# Patient Record
Sex: Female | Born: 1955 | Race: Black or African American | Marital: Single | Smoking: Never smoker
Health system: Southern US, Community
[De-identification: ages and names within clinical notes are randomized; demographics above are authoritative.]

## PROBLEM LIST (undated history)

## (undated) ENCOUNTER — Ambulatory Visit (INDEPENDENT_AMBULATORY_CARE_PROVIDER_SITE_OTHER): Admission: RE | Payer: Self-pay

## (undated) ENCOUNTER — Ambulatory Visit (INDEPENDENT_AMBULATORY_CARE_PROVIDER_SITE_OTHER): Admission: RE | Payer: Self-pay | Admitting: Vascular Neurology

## (undated) DIAGNOSIS — D509 Iron deficiency anemia, unspecified: Secondary | ICD-10-CM

## (undated) DIAGNOSIS — H269 Unspecified cataract: Secondary | ICD-10-CM

## (undated) DIAGNOSIS — R0602 Shortness of breath: Secondary | ICD-10-CM

## (undated) DIAGNOSIS — E785 Hyperlipidemia, unspecified: Secondary | ICD-10-CM

## (undated) DIAGNOSIS — I10 Essential (primary) hypertension: Secondary | ICD-10-CM

## (undated) DIAGNOSIS — M1990 Unspecified osteoarthritis, unspecified site: Secondary | ICD-10-CM

## (undated) DIAGNOSIS — R7303 Prediabetes: Secondary | ICD-10-CM

## (undated) HISTORY — PX: TONSILLECTOMY: SUR1361

## (undated) HISTORY — PX: OTHER SURGICAL HISTORY: SHX169

## (undated) HISTORY — PX: ADENOIDECTOMY: SUR15

## (undated) HISTORY — PX: MYOMECTOMY ABDOMINAL APPROACH: SUR870

## (undated) HISTORY — DX: Hyperlipidemia, unspecified: E78.5

## (undated) HISTORY — DX: Essential (primary) hypertension: I10

## (undated) HISTORY — PX: WISDOM TOOTH EXTRACTION: SHX21

## (undated) HISTORY — DX: Iron deficiency anemia, unspecified: D50.9

## (undated) HISTORY — PX: COLONOSCOPY: SHX174

## (undated) HISTORY — DX: Shortness of breath: R06.02

---

## 1992-06-18 HISTORY — PX: DIAGNOSTIC LAPAROSCOPY: SUR761

## 1997-05-27 ENCOUNTER — Ambulatory Visit
Admit: 1997-05-27 | Disposition: A | Discharge status: Home / Self Care | Payer: Self-pay | Source: Ambulatory Visit | Admitting: Physical Medicine & Rehabilitation

## 2003-06-21 ENCOUNTER — Ambulatory Visit: Admit: 2003-06-21 | Disposition: A | Discharge status: Home / Self Care | Payer: Self-pay | Source: Ambulatory Visit

## 2004-08-21 ENCOUNTER — Ambulatory Visit: Admit: 2004-08-21 | Disposition: A | Discharge status: Home / Self Care | Payer: Self-pay | Source: Ambulatory Visit

## 2004-09-14 ENCOUNTER — Ambulatory Visit: Admit: 2004-09-14 | Disposition: A | Discharge status: Home / Self Care | Payer: Self-pay | Source: Ambulatory Visit

## 2006-12-05 ENCOUNTER — Ambulatory Visit
Admit: 2006-12-05 | Disposition: A | Discharge status: Home / Self Care | Payer: Self-pay | Source: Ambulatory Visit | Admitting: Gynecology

## 2006-12-11 ENCOUNTER — Ambulatory Visit
Admit: 2006-12-11 | Disposition: A | Discharge status: Home / Self Care | Payer: Self-pay | Source: Ambulatory Visit | Admitting: Gynecology

## 2009-06-18 HISTORY — PX: BIOPSY BREAST: PRO8

## 2010-05-24 ENCOUNTER — Ambulatory Visit: Payer: Self-pay

## 2010-05-29 LAB — LAB USE ONLY - HISTORICAL SURGICAL PATHOLOGY

## 2011-03-28 ENCOUNTER — Encounter (INDEPENDENT_AMBULATORY_CARE_PROVIDER_SITE_OTHER): Payer: Self-pay | Admitting: Vascular Neurology

## 2011-04-23 ENCOUNTER — Other Ambulatory Visit (INDEPENDENT_AMBULATORY_CARE_PROVIDER_SITE_OTHER): Payer: Self-pay | Admitting: Vascular Neurology

## 2011-04-23 DIAGNOSIS — R51 Headache: Secondary | ICD-10-CM

## 2011-04-23 MED ORDER — AMITRIPTYLINE HCL 50 MG PO TABS
ORAL_TABLET | ORAL | Status: DC
Start: 2011-04-23 — End: 2011-09-14

## 2011-05-22 ENCOUNTER — Encounter (INDEPENDENT_AMBULATORY_CARE_PROVIDER_SITE_OTHER): Payer: Self-pay | Admitting: Vascular Neurology

## 2011-05-23 ENCOUNTER — Ambulatory Visit (INDEPENDENT_AMBULATORY_CARE_PROVIDER_SITE_OTHER): Payer: Worker's Compensation | Admitting: Vascular Neurology

## 2011-05-23 ENCOUNTER — Encounter (INDEPENDENT_AMBULATORY_CARE_PROVIDER_SITE_OTHER): Payer: Self-pay | Admitting: Vascular Neurology

## 2011-05-23 VITALS — BP 144/95 | HR 93 | Ht 62.0 in | Wt 157.8 lb

## 2011-05-23 DIAGNOSIS — R51 Headache: Secondary | ICD-10-CM

## 2011-05-23 DIAGNOSIS — R519 Headache, unspecified: Secondary | ICD-10-CM | POA: Insufficient documentation

## 2011-05-23 DIAGNOSIS — G44309 Post-traumatic headache, unspecified, not intractable: Secondary | ICD-10-CM | POA: Insufficient documentation

## 2011-05-23 DIAGNOSIS — R202 Paresthesia of skin: Secondary | ICD-10-CM

## 2011-05-23 DIAGNOSIS — R209 Unspecified disturbances of skin sensation: Secondary | ICD-10-CM

## 2011-05-23 NOTE — Progress Notes (Signed)
Subjective:      Patient ID: Bridget Gomez is a 55 y.o. female.    HPI  Since I last saw the patient, her headaches have been stable.  If she takes  the medications, the headaches are under fairly good control.  However, if  she has missed medication, the headache seems to be more prominent and  causes more issues.  She has been back at work and has not had any issues,  except when she forgets to take medication.  She has had to call in sick  from work few times because of this.  Strenuous movement seems to trigger  the headaches.  The headaches are typically holoacranial, of moderate  intensity, but without nausea or photophobia.  She recently began seeing an  orthopedist for complaints of right hand numbness.  She complains of  paresthesias in the right hand.  There is a prior history of carpal tunnel  syndrome.  She had been referred for physical therapy and has been using a  wrist brace.        Review of Systems   Neurological: Positive for numbness and headaches.       Objective:   Neurologic Exam    On exam, the patient is awake, alert, oriented and appropriate. Speech is fluent and he follows commands well. Attention and concentration appear intact. Cranial nerves show pupils equally round and reactive to light bilaterally. Extra-ocular movements are intact, without clear nystagmus. Face is symmetric. Tongue is midline. Motor strength is full throughout with normal appearing bulk. Sensation to light touch is grossly intact. Coordination by finger-to-nose testing was normal without dysmetria. Gait was normal and steady. + R Tinels      Assessment:     1. Headache - overall stable on current regimen.   2. Post-concussion headache    3. 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 Ortho has ordered EMG/NCS.         Plan:     1. Continue on Elavil - currently on 140mg  qhs; will try to reduce dose by 10 mg/week down to 100 mg and see if symptoms worsen or remain stable.  2. No change in Neurontin dose.  3.  Follow ortho recs RE: R arm; EMG/NCS.  4. Follow up few months.    The patient should call the office if symptoms worsen, new issues arise, or if has further questions.

## 2011-06-01 ENCOUNTER — Ambulatory Visit (INDEPENDENT_AMBULATORY_CARE_PROVIDER_SITE_OTHER): Payer: Self-pay | Admitting: Vascular Neurology

## 2011-06-06 ENCOUNTER — Encounter (INDEPENDENT_AMBULATORY_CARE_PROVIDER_SITE_OTHER): Payer: Self-pay | Admitting: Neurology

## 2011-06-06 ENCOUNTER — Other Ambulatory Visit (INDEPENDENT_AMBULATORY_CARE_PROVIDER_SITE_OTHER): Payer: Self-pay | Admitting: Vascular Neurology

## 2011-06-06 ENCOUNTER — Ambulatory Visit (INDEPENDENT_AMBULATORY_CARE_PROVIDER_SITE_OTHER): Payer: BC Managed Care – PPO

## 2011-06-06 ENCOUNTER — Ambulatory Visit (INDEPENDENT_AMBULATORY_CARE_PROVIDER_SITE_OTHER): Payer: BC Managed Care – PPO | Admitting: Neurology

## 2011-06-06 VITALS — BP 126/87 | HR 99 | Ht 62.0 in | Wt 160.0 lb

## 2011-06-06 DIAGNOSIS — G5600 Carpal tunnel syndrome, unspecified upper limb: Secondary | ICD-10-CM

## 2011-06-06 DIAGNOSIS — R202 Paresthesia of skin: Secondary | ICD-10-CM

## 2011-06-06 DIAGNOSIS — R209 Unspecified disturbances of skin sensation: Secondary | ICD-10-CM

## 2011-06-06 DIAGNOSIS — G5610 Other lesions of median nerve, unspecified upper limb: Secondary | ICD-10-CM

## 2011-06-06 NOTE — Progress Notes (Signed)
This is a 55 year old woman who follows up with Dr. Stacy Gardner, who is here for  EMG nerve conduction studies.  She has been referred by her orthopedic  doctor.  She has had symptoms of numbness, tingling, and spasm especially  in her right hand.     PHYSICAL EXAMINATION:    Strength is full in bilateral upper extremities.  Deep tendon reflexes are  bilaterally symmetrical, and sensation is intact to touch.     FINDINGS:  1.  The right median palmar sensory response showed a slightly prolonged  latency.  The left median palmar sensory response also showed very  minimally prolonged latency.  The bilateral radial sensory responses were  unremarkable.    2.  The right median motor response showed slightly prolonged  latency with normal amplitude and conduction velocity.  The left  median motor and bilateral ulnar motor responses were unremarkable.   3.  Bilateral median and ulnar F-wave latencies were unremarkable.    4.  Needle electromyography of the mentioned muscles of bilateral upper extremities was  unremarkable.     IMPRESSION:  This is an abnormal study.  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 There is no evidence of a  generalized large fiber neuropathy, myopathy, or bilateral cervical radiculopathy  on this study.  Clinical and radiological correlation is recommended.

## 2011-06-25 NOTE — Progress Notes (Signed)
Gwenevere Abbot, MD 06/09/2011 8:23 PM Signed   This is a 56 year old woman who follows up with Dr. Stacy Gardner, who is here for   EMG nerve conduction studies. She has been referred by her orthopedic   doctor. She has had symptoms of numbness, tingling, and spasm especially   in her right hand.   PHYSICAL EXAMINATION:   Strength is full in bilateral upper extremities. Deep tendon reflexes are   bilaterally symmetrical, and sensation is intact to touch.   FINDINGS:   1. The right median palmar sensory response showed a slightly prolonged   latency. The left median palmar sensory response also showed very   minimally prolonged latency. The bilateral radial sensory responses were   unremarkable.   2. The right median motor response showed slightly prolonged   latency with normal amplitude and conduction velocity. The left   median motor and bilateral ulnar motor responses were unremarkable.   3. Bilateral median and ulnar F-wave latencies were unremarkable.   4. Needle electromyography of the mentioned muscles of bilateral upper extremities was   unremarkable.   IMPRESSION:   This is an abnormal study. 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 There is no evidence of a   generalized large fiber neuropathy, myopathy, or bilateral cervical radiculopathy   on this study. Clinical and radiological correlation is recommended.

## 2011-08-21 ENCOUNTER — Ambulatory Visit (INDEPENDENT_AMBULATORY_CARE_PROVIDER_SITE_OTHER): Payer: Self-pay | Admitting: Vascular Neurology

## 2011-08-23 ENCOUNTER — Ambulatory Visit (INDEPENDENT_AMBULATORY_CARE_PROVIDER_SITE_OTHER): Payer: Self-pay | Admitting: Vascular Neurology

## 2011-08-24 ENCOUNTER — Encounter (INDEPENDENT_AMBULATORY_CARE_PROVIDER_SITE_OTHER): Payer: Self-pay | Admitting: Vascular Neurology

## 2011-09-14 ENCOUNTER — Ambulatory Visit (INDEPENDENT_AMBULATORY_CARE_PROVIDER_SITE_OTHER): Payer: Worker's Compensation | Admitting: Vascular Neurology

## 2011-09-14 ENCOUNTER — Encounter (INDEPENDENT_AMBULATORY_CARE_PROVIDER_SITE_OTHER): Payer: Self-pay | Admitting: Vascular Neurology

## 2011-09-14 VITALS — BP 124/84 | HR 88 | Ht 62.0 in | Wt 160.0 lb

## 2011-09-14 DIAGNOSIS — G44309 Post-traumatic headache, unspecified, not intractable: Secondary | ICD-10-CM

## 2011-09-14 DIAGNOSIS — R51 Headache: Secondary | ICD-10-CM

## 2011-09-14 DIAGNOSIS — R209 Unspecified disturbances of skin sensation: Secondary | ICD-10-CM

## 2011-09-14 DIAGNOSIS — R202 Paresthesia of skin: Secondary | ICD-10-CM

## 2011-09-14 DIAGNOSIS — M542 Cervicalgia: Secondary | ICD-10-CM

## 2011-09-14 MED ORDER — AMITRIPTYLINE HCL 50 MG PO TABS
100.00 mg | ORAL_TABLET | Freq: Every evening | ORAL | Status: DC
Start: 2011-09-14 — End: 2013-03-10

## 2011-09-14 NOTE — Progress Notes (Signed)
Subjective:      Patient ID: Bridget Gomez is a 56 y.o. female.    HPI  Since I last saw the patient, her headaches have been getting better.   However, she did have another work injury recently.  She was in a chair,  and the chair fell on her and hit the back of her neck.  She had a bad  headache then, but the symptoms have subsided a bit.  She has been doing  physical therapy for her neck and back discomfort.  She has been seeing a  workplace doctor for that.  Despite the recent incident, her headaches have  been doing well.  She has been able to cut down to 100 mg nightly of  Elavil, and her headaches have remained stable.  The gabapentin is  unchanged.  A recent EMG nerve conduction testing showed right greater than  left carpal tunnel syndrome, and she was given a steroid shot by her  orthopedists.  The symptoms have subsided since then.        Review of Systems   Musculoskeletal: Positive for back pain.   Neurological: Positive for headaches.   All other systems reviewed and are negative.        Objective:   Neurologic Exam    On exam, the patient is awake, alert, oriented and appropriate. Speech is fluent and the patient follows commands well. Attention, memory, and concentration appear intact. Cranial nerves show pupils equally round and reactive to light bilaterally. Extra-ocular movements are intact, without clear nystagmus. Face is symmetric. Tongue is midline. Motor strength is full throughout without clear focality, with normal appearing bulk. Sensation to light touch is grossly intact. Coordination by finger-to-nose testing was normal without dysmetria. Gait was normal and steady.      Assessment:     1. Headache - overall improved, but recent flare with most recent injury.   2. Post-concussion headache    3. Paresthesia of left upper limb    4. Neck pain          Plan:     1. Can continue to try and reduce Elavil by 10 mg/week if tolerated.  2. Continue with Neurontin at current dose.  3. Wrist  splints prn.  4. Continue PT for neck/back.  5. FMLA forms filled out in conjunction with this visit.  5. Follow up in several months.    The patient will return in follow-up as outlined above. If there is difficulty with the medications or questions about the test results as they become available and/or if the patient develops any new neurologic symptoms or worsening of current symptoms, he/she is instructed to contact us by telephone.    NOTE: In conjunction with todays evaluation and exam, the patient may have filled out forms with additional information about their medical history. This form would be scanned in the system for future reference, and was reviewed by the physician as part of the patient's evaluation.

## 2011-10-01 ENCOUNTER — Encounter (INDEPENDENT_AMBULATORY_CARE_PROVIDER_SITE_OTHER): Payer: Self-pay

## 2011-10-01 NOTE — Progress Notes (Signed)
Forms completed and faxed to [employer].

## 2011-10-02 ENCOUNTER — Encounter (INDEPENDENT_AMBULATORY_CARE_PROVIDER_SITE_OTHER): Payer: Self-pay | Admitting: Vascular Neurology

## 2011-10-10 ENCOUNTER — Other Ambulatory Visit (INDEPENDENT_AMBULATORY_CARE_PROVIDER_SITE_OTHER): Payer: Self-pay | Admitting: Vascular Neurology

## 2011-10-30 ENCOUNTER — Encounter (INDEPENDENT_AMBULATORY_CARE_PROVIDER_SITE_OTHER): Payer: Self-pay | Admitting: Vascular Neurology

## 2011-12-24 ENCOUNTER — Ambulatory Visit (INDEPENDENT_AMBULATORY_CARE_PROVIDER_SITE_OTHER): Payer: Worker's Compensation | Admitting: Vascular Neurology

## 2011-12-24 VITALS — BP 129/87 | HR 91

## 2011-12-24 DIAGNOSIS — G44309 Post-traumatic headache, unspecified, not intractable: Secondary | ICD-10-CM

## 2011-12-24 DIAGNOSIS — R51 Headache: Secondary | ICD-10-CM

## 2011-12-24 DIAGNOSIS — M542 Cervicalgia: Secondary | ICD-10-CM

## 2011-12-24 NOTE — Progress Notes (Signed)
Subjective:      Patient ID: Bridget Gomez is a 56 y.o. female.    HPI  The patient has been doing fairly well from a headache standpoint.  For the  most part, her headaches have improved significantly, and she has been  reducing the Elavil.  She recently reduced it to 50 mg nightly, last night.   At one point, she did have a headache and had increased the dose back, but  she has been able to cut back to 50 mg.  She has occasional head pains,  they usually last no more than a few seconds.  She has used Fioricet on a  p.r.n. basis.  Her neck still is an issue, and she has been getting  physical therapy and occupational therapy.  She may be sent to another  specialist at some point in the near future.  She wondered about whether  acupuncture could be helpful.  She otherwise continues on gabapentin,  Skelaxin, naproxen and inderal in addition to the Elavil.     She reports no other new complaints today.        Review of Systems    Constitutional: Negative for fever.   Respiratory: Negative for shortness of breath.    Cardiovascular: Negative for chest pain.   Gastrointestinal: Negative for abdominal pain.   Neurological: Negative for dizziness, weakness, numbness and headaches.   Psychiatric/Behavioral: Negative for disturbed wake/sleep cycle.   All other systems reviewed and are negative.      Objective:   Neurologic Exam    On exam, the patient is awake, alert, oriented and appropriate. Speech is fluent and the patient follows commands well. Attention, memory, and concentration appear intact. Cranial nerves show pupils equally round and reactive to light bilaterally. Extra-ocular movements are intact, without clear nystagmus. Face is symmetric. Tongue is midline. Motor strength is full throughout without clear focality, with normal appearing bulk. Sensation to light touch is grossly intact. Coordination by finger-to-nose testing was normal without dysmetria. Gait was normal and steady.      Assessment:     1.  Headache    2. Post-concussion headache    3. Neck pain          Plan:     1. Continue to reduce Elavil by 10mg  every few weeks.  2. Naproxen or Fioricet prn.  3. Continue Gabapentin.  4. Skelaxin, PT for neck pain. ? Consider acupuncture.  5. Follow up in several months.    The patient will return in follow-up as outlined above. If there is difficulty with the medications and/or if the patient develops any new neurologic symptoms or worsening of current symptoms, he/she is instructed to contact us by telephone.    NOTE: In conjunction with todays evaluation and exam, the patient may have filled out forms with additional information about their medical history. This form would be scanned in the system for future reference, and was reviewed by the physician as part of the patient's evaluation.

## 2012-04-07 ENCOUNTER — Encounter (INDEPENDENT_AMBULATORY_CARE_PROVIDER_SITE_OTHER): Payer: Self-pay

## 2012-04-07 NOTE — Progress Notes (Signed)
Called patient medication into the pharmacy Neurontin 300 mg TID DISP 180 CAP REFILLS 6

## 2012-04-29 ENCOUNTER — Ambulatory Visit (INDEPENDENT_AMBULATORY_CARE_PROVIDER_SITE_OTHER): Payer: Worker's Comp, Other unspecified | Admitting: Vascular Neurology

## 2012-04-29 VITALS — BP 122/82 | HR 94 | Ht 62.0 in | Wt 169.0 lb

## 2012-04-29 MED ORDER — BUTALBITAL-APAP-CAFFEINE 50-325-40 MG PO TABS
1.00 | ORAL_TABLET | Freq: Four times a day (QID) | ORAL | Status: DC | PRN
Start: 2012-04-29 — End: 2013-03-10

## 2012-04-29 NOTE — Progress Notes (Signed)
Subjective:       Patient ID: Bridget Gomez is a 56 y.o. female.    HPI    Since I last saw the patient, she has tapered off Elavil, about 2 months  ago, and has only had 1 bad headache since then.  She did not have the  Fioricet available unfortunately.  She has occasional paresthesia feelings in  her head at times, but for the most part has done well from a headache  standpoint.  She has not needed naproxen.  She continues on gabapentin 300  mg in the morning and 600 mg in the afternoon and evening.  She is now off  Skelaxin.  Since I last saw her, she had epidural injection for her neck  and has been getting trigger point injections which seemed to help.  She is  thinking that if she continues to improve, she may be able to go back to  work by next month.  She does not report any other new symptoms.      Review of Systems    Constitutional: Negative for fever.   Respiratory: Negative for shortness of breath.    Cardiovascular: Negative for chest pain.   Gastrointestinal: Negative for abdominal pain.   Neurological: Negative for dizziness, weakness, numbness and headaches.   Psychiatric/Behavioral: Negative for sleep disturbance.   All other systems reviewed and are negative.        Objective:    Physical Exam  The patient's mental status appears normal. Speech is fluent, and the patient follows commands well. Attention, memory, and concentration appear intact. Fund of knowledge appears appropriate. Cranial nerves show full extraocular movements and a symmetric face. Tongue appears midline. Motor exam shows good movement in all extremities without clear focality. Sensation grossly intact. Gait was normal and steady.        Assessment:       1. Headache    2. Post-concussion headache    3. Neck pain      She continues to make steady improvement with less frequent headaches now.          Plan:       1.  She can try tapering the gabapentin by 300 mg weekly for the next  several weeks to see if she continues to do  well on a lower dose.   2.  Fioricet as needed for headaches.  3.  Continue with pain management/trigger point injections, as long as she  finds them helpful.  4.  She has Skelaxin which can be used as needed, although she does not  need to use this.    5.  Follow up with me in a few months or contact the office sooner if  needed.    The patient will return for follow-up as outlined above. If there are any problems with medications (if prescribed) or questions about any available test results, or if there are any new symptoms or changes in current symptoms, the patient is instructed to call the office.     The patient may have filled out a questionnaire as part of the office visit and that information would be scanned into the chart and become part of the office visit. Notes from Dr. Stacy Gardner may also be scanned in and become part of the office visit.     More than 50% of the office visit was spent counseling the patient on one or more of the following:   -Disease state - discussion about the medical/neurological condition,  diagnostic and treatment options;  -Medications - indications and side effects   -Life style issues such as diet, exercise, tobacco and alcohol use, sleep, stress, depression and anxiety   -Primary and secondary stroke prevention, if applicable.    Wilda Wetherell B. Stacy Gardner, MD  Board Certified, Neurology  Board Certified, Clinical Neurophysiology  Board Certified, Electrodiagnostic Medicine  Board Certified, Vascular Neurology

## 2012-08-01 ENCOUNTER — Ambulatory Visit (INDEPENDENT_AMBULATORY_CARE_PROVIDER_SITE_OTHER): Payer: Worker's Comp, Other unspecified | Admitting: Vascular Neurology

## 2012-08-18 ENCOUNTER — Ambulatory Visit (INDEPENDENT_AMBULATORY_CARE_PROVIDER_SITE_OTHER): Payer: Worker's Comp, Other unspecified | Admitting: Vascular Neurology

## 2012-09-08 ENCOUNTER — Ambulatory Visit (INDEPENDENT_AMBULATORY_CARE_PROVIDER_SITE_OTHER): Payer: Worker's Comp, Other unspecified | Admitting: Vascular Neurology

## 2012-09-08 VITALS — BP 109/73 | HR 71 | Ht 62.0 in | Wt 153.0 lb

## 2012-09-08 NOTE — Progress Notes (Signed)
Subjective:      Patient ID: Bridget Gomez is a 57 y.o. female.    HPI  The patient continues to do better and things are going fairly well.  She  did have 1 "decent" headache in December, for which she took Fioricet, but  otherwise is stable.  She has been tapering off Gabapentin.  She has not  had any recent trigger point injections and may go back for some as her  neck has been bothering her.  She has been using a cream on her neck which  seems to help.      Review of Systems    Constitutional: Negative for fever.   Respiratory: Negative for shortness of breath.    Cardiovascular: Negative for chest pain.   Gastrointestinal: Negative for abdominal pain.   Neurological: Negative for dizziness, weakness, numbness and headaches.   Psychiatric/Behavioral: Negative for sleep disturbance.   All other systems reviewed and are negative.    Objective:   Neurologic Exam  Mental Status: The patient was awake, alert, appeared oriented and was appropriate. Speech was fluent and the patient followed commands well. Attention, concentration, and memory appeared intact. Fund of knowledge appeared appropriate for education level.   Cranial Nerves: Pupils were equally round and reactive to light bilaterally. Extraocular movements were intact without nystagmus. Hearing was intact to conversational speech. Face was symmetric. Tongue appeared midline.   Motor: Good/full strength throughout without clear focality or weakness. Bulk appeared normal for body habitus. No obvious tremor noted.   Sensation: light touch was grossly intact.   Coordination: Finger to nose testing was intact without dysmetria.   Gait: Normal and steady.      Assessment:     1. Headache    2. Post-concussion headache    3. Neck pain      Overall doing much better.        Plan:     1. Fioricet prn.  2. Continue tapering off GBP.  3. Follow up for TPI if needed.  4. Follow up in 6 months or contact the office sooner if needed.    The patient will return for  follow-up as outlined above. If there are any problems with medications (if prescribed) or questions about any available test results, or if there are any new symptoms or changes in current symptoms, the patient is instructed to call the office.     The patient may have filled out a questionnaire as part of the office visit and that information would be scanned into the chart and become part of the office visit. Notes from Dr. Stacy Gardner may also be scanned in and become part of the office visit.     More than 50% of the office visit was spent counseling the patient on one or more of the following:   -Disease state - discussion about the medical/neurological condition, diagnostic and treatment options;  -Medications - indications and side effects   -Life style issues such as diet, exercise, tobacco and alcohol use, sleep, stress, depression and anxiety   -Primary and secondary stroke prevention, if applicable.    Jody Silas B. Stacy Gardner, MD  Board Certified, Neurology  Board Certified, Clinical Neurophysiology  Board Certified, Electrodiagnostic Medicine  Board Certified, Vascular Neurology

## 2012-11-17 ENCOUNTER — Encounter (INDEPENDENT_AMBULATORY_CARE_PROVIDER_SITE_OTHER): Payer: Self-pay

## 2013-03-02 ENCOUNTER — Ambulatory Visit (INDEPENDENT_AMBULATORY_CARE_PROVIDER_SITE_OTHER): Payer: Worker's Comp, Other unspecified | Admitting: Vascular Neurology

## 2013-03-10 ENCOUNTER — Encounter (INDEPENDENT_AMBULATORY_CARE_PROVIDER_SITE_OTHER): Payer: Self-pay | Admitting: Vascular Neurology

## 2013-03-10 ENCOUNTER — Ambulatory Visit (INDEPENDENT_AMBULATORY_CARE_PROVIDER_SITE_OTHER): Payer: Worker's Comp, Other unspecified | Admitting: Vascular Neurology

## 2013-03-10 VITALS — BP 108/73 | HR 73

## 2013-03-10 DIAGNOSIS — G4700 Insomnia, unspecified: Secondary | ICD-10-CM | POA: Insufficient documentation

## 2013-03-10 MED ORDER — TRAZODONE HCL 50 MG PO TABS
25.0000 mg | ORAL_TABLET | Freq: Every evening | ORAL | Status: DC
Start: 2013-03-10 — End: 2013-07-20

## 2013-03-10 MED ORDER — BUTALBITAL-APAP-CAFFEINE 50-325-40 MG PO TABS
1.0000 | ORAL_TABLET | Freq: Four times a day (QID) | ORAL | Status: AC | PRN
Start: 2013-03-10 — End: 2013-03-20

## 2013-03-10 NOTE — Progress Notes (Signed)
Subjective:      Patient ID: Bridget Gomez is a 57 y.o. female.    HPI    The patient says things are done relatively well, although she did have  another injury that was somewhat work-related.  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  She had a CT of her head  that was negative, and she had some right arm discomfort but that has  subsequently improved.  She had a headache last week, but otherwise her  headaches seem to be the same frequency they were previously.  She was not  able to go to work with the last headache, because of the work schedule.   She is generally the same as before her most recent accident.  However, if  the headache occurs at work.  It does affect her ability to work.  She is  now off gabapentin and Elavil and lost 20+ pounds.  She uses Fioricet on an  as needed basis which seems to work relatively well.  She also has had continued issues with insomnia, which had been helped by  the Elavil.  She describes difficulty getting to sleep and at times wakes  up during the night.          Review of Systems    Constitutional: Negative for fever.   Respiratory: Negative for shortness of breath.    Cardiovascular: Negative for chest pain.   Gastrointestinal: Negative for abdominal pain.   Neurological: Negative for dizziness, weakness, numbness.   Psychiatric/Behavioral: Negative for sleep disturbance.   All other systems reviewed and are negative.    Objective:   Neurologic Exam  Mental Status: The patient was awake, alert, appeared oriented and was appropriate. Speech was fluent and the patient followed commands well. Attention, concentration, and memory appeared intact. Fund of knowledge appeared appropriate for education level.   Cranial Nerves: Pupils were equally round and reactive to light bilaterally. Extraocular movements were intact without nystagmus. Hearing was intact to conversational speech. Face was symmetric. Tongue appeared midline.   Motor: Good/full  strength throughout without clear focality or weakness. Bulk appeared normal for body habitus. No obvious tremor noted.   Sensation: light touch was grossly intact.   Coordination: Finger to nose testing was intact without dysmetria.   Gait: Normal and steady.      Assessment:     1. Post-concussion headache    2. Headache    3. Insomnia    4. Sleep disturbance    5. Headache(784.0)        Plan:     1. Fioricet prn.  2. Monitor for other new symptoms.   3. Filled out [employer] forms in conjunction with this visit.  4. Trial of trazodone for insomnia.  5. Follow up in a few months or contact the office sooner if needed.    The patient will return for follow-up as outlined above. If there are any problems with medications (if prescribed) or questions about any available test results, or if there are any new symptoms or changes in current symptoms, the patient is instructed to call the office.     The patient may have filled out a questionnaire as part of the office visit and that information would be scanned into the chart and become part of the office visit. Notes from Dr. Stacy Gardner may also be scanned in and become part of the office visit.     More than 50%  of the office visit was spent counseling the patient on one or more of the following:   -Disease state - discussion about the medical/neurological condition, diagnostic and treatment options;  -Medications - indications and side effects   -Life style issues such as diet, exercise, tobacco and alcohol use, sleep, stress, depression and anxiety   -Primary and secondary stroke prevention, if applicable.    Mackensie Pilson B. Stacy Gardner, MD  Board Certified, Neurology  Board Certified, Clinical Neurophysiology  Board Certified, Electrodiagnostic Medicine  Board Certified, Vascular Neurology

## 2013-03-16 ENCOUNTER — Ambulatory Visit (INDEPENDENT_AMBULATORY_CARE_PROVIDER_SITE_OTHER): Payer: Worker's Comp, Other unspecified | Admitting: Vascular Neurology

## 2013-04-07 ENCOUNTER — Encounter (INDEPENDENT_AMBULATORY_CARE_PROVIDER_SITE_OTHER): Payer: Self-pay | Admitting: Vascular Neurology

## 2013-04-19 ENCOUNTER — Encounter (INDEPENDENT_AMBULATORY_CARE_PROVIDER_SITE_OTHER): Payer: Self-pay | Admitting: Vascular Neurology

## 2013-05-21 ENCOUNTER — Encounter (INDEPENDENT_AMBULATORY_CARE_PROVIDER_SITE_OTHER): Payer: Self-pay | Admitting: Vascular Neurology

## 2013-05-29 ENCOUNTER — Ambulatory Visit (INDEPENDENT_AMBULATORY_CARE_PROVIDER_SITE_OTHER): Payer: Worker's Comp, Other unspecified | Admitting: Vascular Neurology

## 2013-06-03 ENCOUNTER — Telehealth (INDEPENDENT_AMBULATORY_CARE_PROVIDER_SITE_OTHER): Payer: Self-pay

## 2013-06-03 NOTE — Telephone Encounter (Signed)
Called in a 90 day supply of Trazodone #[phone] refills to pharmacy on file.

## 2013-07-20 ENCOUNTER — Ambulatory Visit (INDEPENDENT_AMBULATORY_CARE_PROVIDER_SITE_OTHER): Payer: Worker's Comp, Other unspecified | Admitting: Vascular Neurology

## 2013-07-20 ENCOUNTER — Encounter (INDEPENDENT_AMBULATORY_CARE_PROVIDER_SITE_OTHER): Payer: Self-pay | Admitting: Vascular Neurology

## 2013-07-20 VITALS — BP 132/86 | HR 71

## 2013-07-20 DIAGNOSIS — G44309 Post-traumatic headache, unspecified, not intractable: Secondary | ICD-10-CM

## 2013-07-20 DIAGNOSIS — G479 Sleep disorder, unspecified: Secondary | ICD-10-CM

## 2013-07-20 DIAGNOSIS — G4700 Insomnia, unspecified: Secondary | ICD-10-CM

## 2013-07-20 DIAGNOSIS — R51 Headache: Secondary | ICD-10-CM

## 2013-07-20 MED ORDER — TRAZODONE HCL 50 MG PO TABS
100.0000 mg | ORAL_TABLET | Freq: Every evening | ORAL | Status: DC
Start: 2013-07-20 — End: 2013-11-02

## 2013-07-20 NOTE — Progress Notes (Signed)
Have you sought care outside of  since we last saw you?   No

## 2013-07-20 NOTE — Progress Notes (Signed)
Subjective:      Patient ID: Bridget Gomez is a 58 y.o. female.    HPI  Things are going relatively well for the patient. In October, November, she  had a few more headaches usually lasting 10 minutes but she was able to  function.  Last week, she had a headache that required her to lie down.   She took Fioricet and was better about an hour later.  Last headache was  associated with increased stress.     She has been using trazodone 75 mg at night, which seems to help with her  sleep, although she still does not sleep throughout the night, but usually  gets at least 5 to 6 hours of solid sleep.        Review of Systems    Constitutional: Negative for fever.   Respiratory: Negative for shortness of breath.    Cardiovascular: Negative for chest pain.   Gastrointestinal: Negative for abdominal pain.   Neurological: Negative for dizziness, weakness, numbness.   Psychiatric/Behavioral: Positive for sleep disturbance.   All other systems reviewed and are negative.    Objective:   Neurologic Exam  Mental Status: The patient was awake, alert, appeared oriented and was appropriate. Speech was fluent and the patient followed commands well. Attention, concentration, and memory appeared intact. Fund of knowledge appeared appropriate for education level.   Cranial Nerves: Pupils were equally round and reactive to light bilaterally. Extraocular movements were intact without nystagmus. Hearing was intact to conversational speech. Face was symmetric. Tongue appeared midline.   Motor: Good/full strength throughout without clear focality or weakness. Bulk appeared normal for body habitus. No obvious tremor noted.   Sensation: light touch was grossly intact.   Coordination: Finger to nose testing was intact without dysmetria.   Gait: Normal and steady.      Assessment:     1. Post-concussion headache    2. Headache    3. Insomnia    4. Sleep disturbance          Plan:     1.  Increase trazodone to 100 mg nightly.  2.  Can use  Fioricet p.r.n. for headaches.  3.  Stress reduction techniques as able.  4.  Try to keep a diary of what may be causing any headaches and avoid  triggers if possible.  5.  Follow up with me in 6 months or contact the office sooner if needed.    The patient will return for follow-up as outlined above. If there are any problems with medications (if prescribed) or questions about any available test results, or if there are any new symptoms or changes in current symptoms, the patient is instructed to call the office.     The patient may have filled out a questionnaire as part of the office visit and that information would be scanned into the chart and become part of the office visit. Notes from Dr. Stacy Gardner may also be scanned in and become part of the office visit.     More than 50% of the office visit was spent counseling the patient on one or more of the following:   -Disease state - discussion about the medical/neurological condition, diagnostic and treatment options;  -Medications - indications and side effects   -Life style issues such as diet, exercise, tobacco and alcohol use, sleep, stress, depression and anxiety   -Primary and secondary stroke prevention, if applicable.    Darriona Dehaas B. Stacy Gardner, MD  Board Certified, Neurology  Board Certified, Clinical Neurophysiology  Board Certified, Electrodiagnostic Medicine  Board Certified, Vascular Neurology

## 2013-11-02 ENCOUNTER — Encounter (INDEPENDENT_AMBULATORY_CARE_PROVIDER_SITE_OTHER): Payer: Self-pay | Admitting: Vascular Neurology

## 2013-11-02 ENCOUNTER — Ambulatory Visit (INDEPENDENT_AMBULATORY_CARE_PROVIDER_SITE_OTHER): Payer: Worker's Comp, Other unspecified | Admitting: Vascular Neurology

## 2013-11-02 VITALS — BP 131/84 | HR 70

## 2013-11-02 DIAGNOSIS — G479 Sleep disorder, unspecified: Secondary | ICD-10-CM

## 2013-11-02 DIAGNOSIS — G44309 Post-traumatic headache, unspecified, not intractable: Secondary | ICD-10-CM

## 2013-11-02 DIAGNOSIS — G4700 Insomnia, unspecified: Secondary | ICD-10-CM

## 2013-11-02 DIAGNOSIS — R51 Headache: Secondary | ICD-10-CM

## 2013-11-02 NOTE — Progress Notes (Signed)
Have you sought care outside of [place] since we last saw you?   No

## 2013-11-02 NOTE — Progress Notes (Signed)
Subjective:      Patient ID: Bridget Gomez is a 58 y.o. female.    HPI  Since last seen, the patient has not had any significant headaches.  She  had 1 mild headache that easily resolved with Fioricet.  She increased the  trazodone to 100 mg nightly.  She is not sure if she is sleeping better, as  she still wakes up at night at times.  Previously, however, she was having  significant stress, and her stress levels are much better currently.  Her  work schedule has stabilized.        Review of Systems  Constitutional: Negative for fever.   Respiratory: Negative for shortness of breath.    Cardiovascular: Negative for chest pain.   Gastrointestinal: Negative for abdominal pain.   Neurological: Negative for dizziness, weakness, numbness and headaches.   Psychiatric/Behavioral: Positive for sleep disturbance.       Objective:   Neurologic Exam  Mental Status: The patient was awake, alert, appeared oriented and was appropriate. Speech was fluent and the patient followed commands well. Attention, concentration, and memory appeared intact. Fund of knowledge appeared appropriate for education level.   Cranial Nerves: Pupils were equally round and reactive to light bilaterally. Extraocular movements were intact without nystagmus. Hearing was intact to conversational speech. Face was symmetric. Tongue appeared midline.   Motor: Good/full strength throughout without clear focality or weakness. Bulk appeared normal for body habitus. No obvious tremor noted.   Sensation: light touch was grossly intact.   Coordination: Finger to nose testing was intact without dysmetria.   Gait: Normal and steady.      Assessment:     1. Post-concussion headache    2. Headache    3. Insomnia    4. Sleep disturbance          Plan:     1.  Increase trazodone to 150 mg nightly.  2.  Fioricet p.r.n. for headaches.  3.  Monitor for any new symptoms.    4.  Followup with me in about 6 months or contact the office sooner if  needed.    The patient will  return for follow-up as outlined above. If there are any problems with medications (if prescribed) or questions about any available test results, or if there are any new symptoms or changes in current symptoms, the patient is instructed to call the office.     Bridget Gomez B. Stacy Gardner, MD  Board Certified, Neurology  Board Certified, Clinical Neurophysiology  Board Certified, Electrodiagnostic Medicine  Board Certified, Vascular Neurology

## 2014-04-02 ENCOUNTER — Other Ambulatory Visit (INDEPENDENT_AMBULATORY_CARE_PROVIDER_SITE_OTHER): Payer: Self-pay

## 2014-04-02 ENCOUNTER — Encounter (INDEPENDENT_AMBULATORY_CARE_PROVIDER_SITE_OTHER): Payer: Self-pay

## 2014-04-02 MED ORDER — TRAZODONE HCL 150 MG PO TABS
150.0000 mg | ORAL_TABLET | Freq: Every evening | ORAL | Status: DC
Start: 2014-04-02 — End: 2014-04-22

## 2014-04-22 ENCOUNTER — Ambulatory Visit (INDEPENDENT_AMBULATORY_CARE_PROVIDER_SITE_OTHER): Payer: Worker's Comp, Other unspecified | Admitting: Vascular Neurology

## 2014-04-22 VITALS — BP 138/85 | HR 71 | Ht 62.0 in | Wt 138.0 lb

## 2014-04-22 DIAGNOSIS — G44309 Post-traumatic headache, unspecified, not intractable: Secondary | ICD-10-CM

## 2014-04-22 DIAGNOSIS — G4489 Other headache syndrome: Secondary | ICD-10-CM

## 2014-04-22 DIAGNOSIS — G479 Sleep disorder, unspecified: Secondary | ICD-10-CM

## 2014-04-22 DIAGNOSIS — G4709 Other insomnia: Secondary | ICD-10-CM

## 2014-04-22 MED ORDER — TRAZODONE HCL 50 MG PO TABS
125.0000 mg | ORAL_TABLET | Freq: Every evening | ORAL | Status: DC
Start: 2014-04-22 — End: 2014-05-04

## 2014-04-22 MED ORDER — BUTALBITAL-APAP-CAFFEINE 50-325-40 MG PO TABS
1.0000 | ORAL_TABLET | Freq: Four times a day (QID) | ORAL | Status: AC | PRN
Start: 2014-04-22 — End: ?

## 2014-04-22 NOTE — Progress Notes (Signed)
Subjective:      Patient ID: Bridget Gomez is a 58 y.o. female.    HPI  The patient has been doing very well since I last saw her.  She had one very mild headache for which she took Fioricet, and this worked well.  Otherwise, she has not really had any headaches.  She is sleeping well, currently on trazodone 125 mg at night.  She says she is "sleeping pretty good".  She also takes propranolol 20 mg a day for blood pressure.  She has had some mild weight gain, about 5-8 pounds.  She denies any other new neurologic symptoms.      Review of Systems  Constitutional: Negative for fever.   Respiratory: Negative for shortness of breath.    Cardiovascular: Negative for chest pain.   Gastrointestinal: Negative for abdominal pain.   Neurological: Negative for dizziness, weakness, numbness and headaches.   Psychiatric/Behavioral: Negative for sleep disturbance.       Objective:   Neurologic Exam  Mental Status: The patient was awake, alert, appeared oriented and was appropriate. Speech was fluent and the patient followed commands well. Attention, concentration, and memory appeared intact. Fund of knowledge appeared appropriate for education level.   Cranial Nerves: Pupils were equally round and reactive to light bilaterally. Extraocular movements were intact without nystagmus. Hearing was intact to conversational speech. Face was symmetric. Tongue appeared midline.   Motor: Good/full strength throughout without clear focality or weakness. Bulk appeared normal for body habitus. No obvious tremor noted.   Sensation: light touch was grossly intact.   Coordination: Finger to nose testing was intact without dysmetria.   Gait: Normal and steady.      Assessment:     1. Post-concussion headache    2. Other headache syndrome    3. Other insomnia    4. Sleep disturbance          Plan:     1.  Continue trazodone 125 mg daily at bedtime.  2.  Fioricet when necessary.  3.  She will work on weight loss efforts.  4.  Follow-up in a year or  contact the office sooner if needed.    The patient will return for follow-up as outlined above. If there are any problems with medications (if prescribed) or questions about any available test results, or if there are any new symptoms or changes in current symptoms, the patient is instructed to call the office.     Matisyn Cabeza B. Stacy Gardner, MD  Board Certified, Neurology  Board Certified, Clinical Neurophysiology  Board Certified, Electrodiagnostic Medicine  Board Certified, Vascular Neurology

## 2014-05-03 ENCOUNTER — Other Ambulatory Visit (INDEPENDENT_AMBULATORY_CARE_PROVIDER_SITE_OTHER): Payer: Self-pay

## 2014-05-04 ENCOUNTER — Other Ambulatory Visit (INDEPENDENT_AMBULATORY_CARE_PROVIDER_SITE_OTHER): Payer: Self-pay

## 2014-05-04 ENCOUNTER — Encounter (INDEPENDENT_AMBULATORY_CARE_PROVIDER_SITE_OTHER): Payer: Self-pay

## 2014-05-04 DIAGNOSIS — G479 Sleep disorder, unspecified: Secondary | ICD-10-CM

## 2014-05-04 DIAGNOSIS — G4709 Other insomnia: Secondary | ICD-10-CM

## 2014-05-04 MED ORDER — TRAZODONE HCL 50 MG PO TABS
125.0000 mg | ORAL_TABLET | Freq: Every evening | ORAL | Status: DC
Start: 2014-05-04 — End: 2015-04-21

## 2014-05-04 NOTE — Progress Notes (Signed)
Spoke with [facility] pharmacist yesterday. Per insurance, they only cover 90-day supply of trazodone. Verbal given to pharmacist to change to 90-day with 3 refills.

## 2015-04-21 ENCOUNTER — Other Ambulatory Visit (INDEPENDENT_AMBULATORY_CARE_PROVIDER_SITE_OTHER): Payer: Self-pay

## 2015-04-21 DIAGNOSIS — G4709 Other insomnia: Secondary | ICD-10-CM

## 2015-04-21 DIAGNOSIS — G479 Sleep disorder, unspecified: Secondary | ICD-10-CM

## 2015-04-21 MED ORDER — TRAZODONE HCL 50 MG PO TABS
125.0000 mg | ORAL_TABLET | Freq: Every evening | ORAL | Status: DC
Start: 2015-04-21 — End: 2015-05-27

## 2015-04-29 ENCOUNTER — Ambulatory Visit (INDEPENDENT_AMBULATORY_CARE_PROVIDER_SITE_OTHER): Payer: Worker's Comp, Other unspecified | Admitting: Vascular Neurology

## 2015-05-27 ENCOUNTER — Other Ambulatory Visit (INDEPENDENT_AMBULATORY_CARE_PROVIDER_SITE_OTHER): Payer: Self-pay

## 2015-05-27 DIAGNOSIS — G479 Sleep disorder, unspecified: Secondary | ICD-10-CM

## 2015-05-27 DIAGNOSIS — G4709 Other insomnia: Secondary | ICD-10-CM

## 2015-05-27 MED ORDER — TRAZODONE HCL 50 MG PO TABS
125.0000 mg | ORAL_TABLET | Freq: Every evening | ORAL | Status: DC
Start: 2015-05-27 — End: 2015-07-29

## 2015-07-29 ENCOUNTER — Encounter (INDEPENDENT_AMBULATORY_CARE_PROVIDER_SITE_OTHER): Payer: Self-pay | Admitting: Vascular Neurology

## 2015-07-29 ENCOUNTER — Other Ambulatory Visit (INDEPENDENT_AMBULATORY_CARE_PROVIDER_SITE_OTHER): Payer: Self-pay

## 2015-07-29 ENCOUNTER — Ambulatory Visit (INDEPENDENT_AMBULATORY_CARE_PROVIDER_SITE_OTHER): Payer: Worker's Comp, Other unspecified | Admitting: Vascular Neurology

## 2015-07-29 VITALS — BP 121/87 | HR 87 | Ht 62.0 in

## 2015-07-29 DIAGNOSIS — G479 Sleep disorder, unspecified: Secondary | ICD-10-CM

## 2015-07-29 DIAGNOSIS — Z87898 Personal history of other specified conditions: Secondary | ICD-10-CM

## 2015-07-29 DIAGNOSIS — G4709 Other insomnia: Secondary | ICD-10-CM

## 2015-07-29 DIAGNOSIS — G44309 Post-traumatic headache, unspecified, not intractable: Secondary | ICD-10-CM

## 2015-07-29 MED ORDER — TRAZODONE HCL 50 MG PO TABS
125.0000 mg | ORAL_TABLET | Freq: Every evening | ORAL | Status: DC
Start: 2015-07-29 — End: 2015-10-27

## 2015-07-29 NOTE — Progress Notes (Signed)
Subjective:      Patient ID: Bridget Gomez is a 60 y.o. female.    HPI  The patient was last seen in November 2015.  She has been doing fairly well, and says her "head feels great".  She has not had any significant headaches in the last year, and has not needed to use Fioricet at all.    Her sleeping is doing better as well, and she continues on trazodone 125 mg daily at bedtime.  She denies any other new neurologic symptoms.      Review of Systems  Constitutional: Negative for fever.   Respiratory: Negative for shortness of breath.    Cardiovascular: Negative for chest pain.   Gastrointestinal: Negative for abdominal pain.   Neurological: Negative for dizziness, weakness, numbness and headaches.   Psychiatric/Behavioral: Negative for sleep disturbance.       Objective:   Neurologic Exam  Mental Status: The patient was awake, alert, appeared oriented and was appropriate. Speech was fluent and the patient followed commands well. Attention, concentration, and memory appeared intact. Fund of knowledge appeared appropriate for education level.   Cranial Nerves: Pupils were equally round and reactive to light bilaterally. Extraocular movements were intact without nystagmus. Hearing was intact to conversational speech. Face was symmetric. Tongue appeared midline.   Motor: Good/full strength throughout without clear focality or weakness. Bulk appeared normal for body habitus. No obvious tremor noted.   Sensation: light touch was grossly intact.   Coordination: Finger to nose testing was intact without dysmetria.   Gait: Normal and steady.      Assessment:     1. Sleep disturbance    2. Other insomnia    3. Hx of headache - resolved    4. Post-concussion headache - resolved          Plan:     1.  Can start to slowly taper the trazodone by 25 mg every few weeks.  2.  If she were to have notable sleeping difficulties, could use melatonin as needed.  3.  Monitor for any additional symptoms, although headaches are now  resolved.  4.  Can follow-up with me as needed, but if she remains on the trazodone.  I would like to see her back in about a year.  She will contact the office with any other questions or concerns.    The patient will return for follow-up as outlined above. If there are any problems with medications (if prescribed) or questions about any available test results, or if there are any new symptoms or changes in current symptoms, the patient is instructed to call the office.     Aryeh Butterfield B. Stacy Gardner, MD  Board Certified, Neurology  Board Certified, Clinical Neurophysiology  Board Certified, Vascular Neurology  Board Certified, Sleep Medicine

## 2015-10-27 ENCOUNTER — Other Ambulatory Visit (INDEPENDENT_AMBULATORY_CARE_PROVIDER_SITE_OTHER): Payer: Self-pay | Admitting: Vascular Neurology

## 2015-12-08 ENCOUNTER — Ambulatory Visit (INDEPENDENT_AMBULATORY_CARE_PROVIDER_SITE_OTHER): Payer: Self-pay | Admitting: Cardiovascular Disease

## 2016-01-27 ENCOUNTER — Other Ambulatory Visit (INDEPENDENT_AMBULATORY_CARE_PROVIDER_SITE_OTHER): Payer: Self-pay | Admitting: Vascular Neurology

## 2016-02-29 ENCOUNTER — Other Ambulatory Visit (INDEPENDENT_AMBULATORY_CARE_PROVIDER_SITE_OTHER): Payer: Self-pay

## 2016-02-29 DIAGNOSIS — G479 Sleep disorder, unspecified: Secondary | ICD-10-CM

## 2016-02-29 MED ORDER — TRAZODONE HCL 50 MG PO TABS
ORAL_TABLET | ORAL | 2 refills | Status: DC
Start: 2016-02-29 — End: 2016-08-13

## 2016-08-13 ENCOUNTER — Other Ambulatory Visit (INDEPENDENT_AMBULATORY_CARE_PROVIDER_SITE_OTHER): Payer: Self-pay | Admitting: Vascular Neurology

## 2016-08-13 DIAGNOSIS — G479 Sleep disorder, unspecified: Secondary | ICD-10-CM

## 2016-09-20 ENCOUNTER — Encounter (INDEPENDENT_AMBULATORY_CARE_PROVIDER_SITE_OTHER): Payer: Self-pay

## 2016-11-13 ENCOUNTER — Other Ambulatory Visit (INDEPENDENT_AMBULATORY_CARE_PROVIDER_SITE_OTHER): Payer: Self-pay | Admitting: Vascular Neurology

## 2016-11-13 DIAGNOSIS — G479 Sleep disorder, unspecified: Secondary | ICD-10-CM

## 2017-02-22 ENCOUNTER — Other Ambulatory Visit (INDEPENDENT_AMBULATORY_CARE_PROVIDER_SITE_OTHER): Payer: Self-pay | Admitting: Vascular Neurology

## 2017-02-22 DIAGNOSIS — G479 Sleep disorder, unspecified: Secondary | ICD-10-CM

## 2018-05-05 ENCOUNTER — Other Ambulatory Visit: Payer: Self-pay | Admitting: Obstetrics & Gynecology

## 2019-09-04 ENCOUNTER — Ambulatory Visit: Payer: Self-pay | Attending: Internal Medicine

## 2019-09-04 DIAGNOSIS — Z23 Encounter for immunization: Secondary | ICD-10-CM

## 2019-09-04 NOTE — Progress Notes (Signed)
Covid-19 Vaccination Clinic  Name:  Deahna Minotti    MRN: [id number] DOB: Aug 07, 1955  09/04/2019  Ms. Villasenor was observed post Covid-19 immunization for 15 minutes without incident. She was provided with Vaccine Information Sheet and instruction to access the V-Safe system.   Ms. Mante was instructed to call 911 with any severe reactions post vaccine: Marland Kitchen Difficulty breathing  . Swelling of face and throat  . A fast heartbeat  . A bad rash all over body  . Dizziness and weakness   Immunizations Administered    Name Date Dose VIS Date Route   Pfizer COVID-19 Vaccine 09/04/2019  2:26 PM 0.3 mL 05/29/2019 Intramuscular   Manufacturer: [employer], [employer]   Lot: [id number]   NDC: [address]-5

## 2019-09-29 ENCOUNTER — Ambulatory Visit: Payer: Self-pay | Attending: Internal Medicine

## 2019-09-29 DIAGNOSIS — Z23 Encounter for immunization: Secondary | ICD-10-CM

## 2019-09-29 NOTE — Progress Notes (Signed)
Covid-19 Vaccination Clinic  Name:  Amanda Mccullough    MRN: [id number] DOB: May 10, 1956  09/29/2019  [name] was observed post Covid-19 immunization for 15 minutes without incident. She was provided with Vaccine Information Sheet and instruction to access the V-Safe system.   [name] was instructed to call 911 with any severe reactions post vaccine: Marland Kitchen Difficulty breathing  . Swelling of face and throat  . A fast heartbeat  . A bad rash all over body  . Dizziness and weakness   Immunizations Administered    Name Date Dose VIS Date Route   Pfizer COVID-19 Vaccine 09/29/2019  3:10 PM 0.3 mL 05/29/2019 Intramuscular   Manufacturer: [employer], [employer]   Lot: [id number]   NDC: [address]-1

## 2020-06-15 ENCOUNTER — Other Ambulatory Visit: Payer: Self-pay

## 2020-06-15 ENCOUNTER — Ambulatory Visit (INDEPENDENT_AMBULATORY_CARE_PROVIDER_SITE_OTHER): Payer: BC Managed Care – PPO | Admitting: Internal Medicine

## 2020-06-15 ENCOUNTER — Encounter: Payer: Self-pay | Admitting: Internal Medicine

## 2020-06-15 VITALS — BP 126/80 | HR 88 | Ht 62.0 in | Wt 156.4 lb

## 2020-06-15 DIAGNOSIS — R079 Chest pain, unspecified: Secondary | ICD-10-CM

## 2020-06-15 DIAGNOSIS — R0609 Other forms of dyspnea: Secondary | ICD-10-CM | POA: Insufficient documentation

## 2020-06-15 DIAGNOSIS — R0600 Dyspnea, unspecified: Secondary | ICD-10-CM | POA: Diagnosis not present

## 2020-06-15 MED ORDER — METOPROLOL TARTRATE 100 MG PO TABS: ORAL_TABLET | ORAL | 0 refills | Status: DC

## 2020-06-15 NOTE — Progress Notes (Signed)
Cardiology Office Note:    Date:  06/15/2020   ID:  Amanda Mccullough, DOB 1955-08-23, MRN [id number]  PCP:  Collene Mares, PA  [facility] Cardiologist:  No primary care provider on file.  [facility] Electrophysiologist:  None   Referring MD: Collene Mares, PA  CC: shortness of breath Consulted for the evaluation of chest pain at the [place], [place], [place]  History of Present Illness:    Tuesday Amanda Mccullough is a 64 y.o. female with a hx of HTN, HLD, IDA who presents for evaluation.  Patient notes that she is feeling well, thought she has some shortness of breath. Has had chest squeezing in two weeks prior.  Discomfort occurs spontaneously and resolved spontaneously.  No associated with exertion.  Patient exertion notable for housework and feels no symptoms.  No DOE.  No PND or orthopnea.  No bendopnea, notes weight gain since COVID, leg swelling, or abdominal swelling.  No syncope or near syncope.  Patient reports prior cardiac testing including 2014ish echo, stress test, no heart catheterizations in [place].  No history of pre-eclampsia.  No Fen-Phen.  Ambulatory BP 134/90.   Past Medical History:  Diagnosis Date  . Hyperlipidemia   . Hypertension   . Iron deficiency anemia   . SOB (shortness of breath)     Past Surgical History:  Procedure Laterality Date  . MYEMECTOMY      Current Medications: Current Meds  Medication Sig  . Ferrous Sulfate (IRON) 28 MG TABS Take 1 tablet by mouth daily in the afternoon.  . fluticasone (FLONASE) 50 MCG/ACT nasal spray Place 1-2 sprays into both nostrils as needed.  . metoprolol tartrate (LOPRESSOR) 100 MG tablet Take 1 tablet by mouth 2 hours prior to CT scan  . Multiple Vitamins-Minerals (CENTRUM SILVER 50+WOMEN PO) Take 1 tablet by mouth daily in the afternoon.  . NON FORMULARY Take 1 tablet by mouth daily at 12 noon. Nature Bounty 24 hr. Immune support  . propranolol (INDERAL) 20 MG tablet Take 20 mg by mouth  daily.     Allergies:   Codeine   Social History   Socioeconomic History  . Marital status: Single    Spouse name: Not on file  . Number of children: Not on file  . Years of education: Not on file  . Highest education level: Not on file  Occupational History  . Not on file  Tobacco Use  . Smoking status: Never Smoker  . Smokeless tobacco: Never Used  Substance and Sexual Activity  . Alcohol use: Not on file  . Drug use: Not on file  . Sexual activity: Not on file  Other Topics Concern  . Not on file  Social History Narrative  . Not on file   Social Determinants of Health   Financial Resource Strain: Not on file  Food Insecurity: Not on file  Transportation Needs: Not on file  Physical Activity: Not on file  Stress: Not on file  Social Connections: Not on file     Family History: The patient's family history includes Cancer - Prostate in her father; Glaucoma in her mother and sister; Heart disease in her sister; Hypertension in her father, mother, sister, sister, and sister; Thyroid disease in her mother and sister. History of coronary artery disease notable for no members. History of heart failure notable for sister with CHF. History of arrhythmia notable for no members.  ROS:   Please see the history of present illness.    Notes  leg pain and slight limitations in mobility All other systems reviewed and are negative.  EKGs/Labs/Other Studies Reviewed:    The following studies were reviewed today:  EKG:   06/15/20 Sinus Rhythm rate 68 WNL  Recent Labs: No results found for requested labs within last 8760 hours.  Recent Lipid Panel No results found for: CHOL, TRIG, HDL, CHOLHDL, VLDL, LDLCALC, LDLDIRECT  [facility] Labs 04/15/20 Hgb 10.7 PLT 270 Cholesterol 235 Trig 220 LDL 141 HDL 55  Risk Assessment/Calculations:     N/A  Physical Exam:    VS:  BP 126/80   Pulse 88   Ht 5\' 2"  (1.575 m)   Wt 156 lb 6.4 oz (70.9 kg)   SpO2 96%   BMI 28.61 kg/m      Wt Readings from Last 3 Encounters:  06/15/20 156 lb 6.4 oz (70.9 kg)    GEN:  Well nourished, well developed in no acute distress HEENT: Normal NECK: No JVD; No carotid bruits LYMPHATICS: No lymphadenopathy CARDIAC: RRR, no murmurs, rubs, gallops RESPIRATORY:  Clear to auscultation without rales, wheezing or rhonchi  ABDOMEN: Soft, non-tender, non-distended MUSCULOSKELETAL:  No edema; No deformity  SKIN: Warm and dry NEUROLOGIC:  Alert and oriented x 3 PSYCHIATRIC:  Normal affect   ASSESSMENT:    1. Chest pain of uncertain etiology   2. DOE (dyspnea on exertion)    PLAN:    In order of problems listed above:  Chest Pain Dyspnea on Exertion - The patient presents with possibly cardiac -  Mobility is limited by arthralgias (started taking glucosamine for her knees) - ASCVD risk estimated at 6.1% - - BMP and BNP - Would recommend CCTA  +/- FFR to exclude obstructive CAD   3 months follow up unless new symptoms or abnormal test results warranting change in plan  Would be reasonable for  Virtual Follow up  Would be reasonable for  APP Follow up  Medication Adjustments/Labs and Tests Ordered: Current medicines are reviewed at length with the patient today.  Concerns regarding medicines are outlined above.  Orders Placed This Encounter  Procedures  . CT CORONARY MORPH W/CTA COR W/SCORE W/CA W/CM &/OR WO/CM  . CT CORONARY FRACTIONAL FLOW RESERVE DATA PREP  . CT CORONARY FRACTIONAL FLOW RESERVE FLUID ANALYSIS  . Pro b natriuretic peptide (BNP)  . Basic metabolic panel  . EKG 12-Lead   Meds ordered this encounter  Medications  . metoprolol tartrate (LOPRESSOR) 100 MG tablet    Sig: Take 1 tablet by mouth 2 hours prior to CT scan    Dispense:  1 tablet    Refill:  0    Patient Instructions  Medication Instructions:  Your physician recommends that you continue on your current medications as directed. Please refer to the Current Medication list given to you  today.  *If you need a refill on your cardiac medications before your next appointment, please call your pharmacy*  Lab Work: You will have labs drawn today: BMET/BNP  Testing/Procedures: Your cardiac CT will be scheduled at one of the below locations:   [facility] [address] [place], [place] [address] [phone]  OR  [facility] [address] [place], [place] [address] [phone]  If scheduled at [facility], please arrive at the [facility] main entrance of [facility] 30 minutes prior to test start time. Proceed to the [facility] Radiology Department (first floor) to check-in and test prep.  If scheduled at  [facility], please arrive 15 mins early for check-in and test prep.  Please follow these instructions carefully (unless otherwise directed):  On the Night Before the Test: . Be sure to Drink plenty of water. . Do not consume any caffeinated/decaffeinated beverages or chocolate 12 hours prior to your test. . Do not take any antihistamines 12 hours prior to your test.  On the Day of the Test: . Drink plenty of water. Do not drink any water within one hour of the test. . Do not eat any food 4 hours prior to the test. . HOLD PROPRANOLOL THE DAY OF THE TEST  . Take metoprolol (Lopressor) two hours prior to test. . FEMALES- please wear underwire-free bra if available  After the Test: . Drink plenty of water. . After receiving IV contrast, you may experience a mild flushed feeling. This is normal. . On occasion, you may experience a mild rash up to 24 hours after the test. This is not dangerous. If this occurs, you can take Benadryl 25 mg and increase your fluid intake. . If you experience trouble breathing, this can be serious. If it is severe call 911 IMMEDIATELY. If it is mild, please call our office. . If you take any of these medications:  Glipizide/Metformin, Avandament, Glucavance, please do not take 48 hours after completing test unless otherwise instructed.  Once we have confirmed authorization from your insurance company, we will call you to set up a date and time for your test. Based on how quickly your insurance processes prior authorizations requests, please allow up to 4 weeks to be contacted for scheduling your Cardiac CT appointment. Be advised that routine Cardiac CT appointments could be scheduled as many as 8 weeks after your provider has ordered it.  For non-scheduling related questions, please contact the cardiac imaging nurse navigator should you have any questions/concerns: Rockwell Alexandria, Cardiac Imaging Nurse Navigator Mitzi Hansen, Interim Cardiac Imaging Nurse Navigator [facility] Direct Office Dial: [phone]   For scheduling needs, including cancellations and rescheduling, please call [place], [phone].  Follow-Up: On 09/21/20 at 9:40AM with Riley Lam, MD    Signed, Christell Constant, MD  06/15/2020 10:50 AM    [facility]

## 2020-06-15 NOTE — Patient Instructions (Addendum)
Medication Instructions:  Your physician recommends that you continue on your current medications as directed. Please refer to the Current Medication list given to you today.  *If you need a refill on your cardiac medications before your next appointment, please call your pharmacy*  Lab Work: You will have labs drawn today: BMET/BNP  Testing/Procedures: Your cardiac CT will be scheduled at one of the below locations:   [facility] [address] [place], [place] [address] [phone]  [place] [address] [place], [place] [address] [phone]  If scheduled at [facility], please arrive at the [facility] main entrance of [facility] 30 minutes prior to test start time. Proceed to the [facility] Radiology Department (first floor) to check-in and test prep.  If scheduled at [facility], please arrive 15 mins early for check-in and test prep.  Please follow these instructions carefully (unless otherwise directed):  On the Night Before the Test: . Be sure to Drink plenty of water. . Do not consume any caffeinated/decaffeinated beverages or chocolate 12 hours prior to your test. . Do not take any antihistamines 12 hours prior to your test.  On the Day of the Test: . Drink plenty of water. Do not drink any water within one hour of the test. . Do not eat any food 4 hours prior to the test. . HOLD PROPRANOLOL THE DAY OF THE TEST  . Take metoprolol (Lopressor) two hours prior to test. . FEMALES- please wear underwire-free bra if available  After the Test: . Drink plenty of water. . After receiving IV contrast, you may experience a mild flushed feeling. This is normal. . On occasion, you may experience a mild rash up to 24 hours after the test. This is not dangerous. If this occurs, you can take Benadryl 25 mg and increase your fluid intake. . If you experience trouble  breathing, this can be serious. If it is severe call 911 IMMEDIATELY. If it is mild, please call our office. . If you take any of these medications: Glipizide/Metformin, Avandament, Glucavance, please do not take 48 hours after completing test unless otherwise instructed.  Once we have confirmed authorization from your insurance company, we will call you to set up a date and time for your test. Based on how quickly your insurance processes prior authorizations requests, please allow up to 4 weeks to be contacted for scheduling your Cardiac CT appointment. Be advised that routine Cardiac CT appointments could be scheduled as many as 8 weeks after your provider has ordered it.  For non-scheduling related questions, please contact the cardiac imaging nurse navigator should you have any questions/concerns: Marchia Bond, Cardiac Imaging Nurse Navigator Burley Saver, Interim Cardiac Imaging Nurse [place] [facility] Direct Office Dial: [phone]   For scheduling needs, including cancellations and rescheduling, please call [place], [phone].  Follow-Up: On 09/21/20 at 9:40AM with Rudean Haskell, MD

## 2020-06-16 LAB — BASIC METABOLIC PANEL
BUN/Creatinine Ratio: 21 (ref 12–28)
BUN: 16 mg/dL (ref 8–27)
CO2: 23 mmol/L (ref 20–29)
Calcium: 9.6 mg/dL (ref 8.7–10.3)
Chloride: 105 mmol/L (ref 96–106)
Creatinine, Ser: 0.78 mg/dL (ref 0.57–1.00)
GFR calc Af Amer: 94 mL/min/{1.73_m2} (ref >59)
GFR calc non Af Amer: 82 mL/min/{1.73_m2} (ref >59)
Glucose: 97 mg/dL (ref 65–99)
Potassium: 4.8 mmol/L (ref 3.5–5.2)
Sodium: 141 mmol/L (ref 134–144)

## 2020-06-16 LAB — PRO B NATRIURETIC PEPTIDE: NT-Pro BNP: 57 pg/mL (ref 0–287)

## 2020-07-12 ENCOUNTER — Telehealth (HOSPITAL_COMMUNITY): Payer: Self-pay | Admitting: Emergency Medicine

## 2020-07-12 NOTE — Telephone Encounter (Signed)
Reaching out to patient to offer assistance regarding upcoming cardiac imaging study; pt verbalizes understanding of appt date/time, parking situation and where to check in, pre-test NPO status and medications ordered, and verified current allergies; name and call back number provided for further questions should they arise Rockwell Alexandria RN Navigator Cardiac Imaging [facility] [phone] office [phone] cell  Holding flonase and propanolol; taking 100mg  metop 2 hr PTA 

## 2020-07-14 ENCOUNTER — Encounter: Payer: BC Managed Care – PPO | Admitting: *Deleted

## 2020-07-14 ENCOUNTER — Ambulatory Visit (HOSPITAL_COMMUNITY)
Admission: RE | Admit: 2020-07-14 | Discharge: 2020-07-14 | Disposition: A | Discharge status: Home / Self Care | Payer: BC Managed Care – PPO | Source: Ambulatory Visit | Attending: Internal Medicine | Admitting: Internal Medicine

## 2020-07-14 ENCOUNTER — Other Ambulatory Visit: Payer: Self-pay

## 2020-07-14 ENCOUNTER — Encounter (HOSPITAL_COMMUNITY): Payer: Self-pay

## 2020-07-14 DIAGNOSIS — R0609 Other forms of dyspnea: Secondary | ICD-10-CM

## 2020-07-14 DIAGNOSIS — R0600 Dyspnea, unspecified: Secondary | ICD-10-CM | POA: Diagnosis not present

## 2020-07-14 DIAGNOSIS — R079 Chest pain, unspecified: Secondary | ICD-10-CM | POA: Insufficient documentation

## 2020-07-14 DIAGNOSIS — Z006 Encounter for examination for normal comparison and control in clinical research program: Secondary | ICD-10-CM

## 2020-07-14 MED ORDER — IOHEXOL 350 MG/ML SOLN
80.0000 mL | Freq: Once | INTRAVENOUS | Status: AC | PRN
  Administered 2020-07-14: 80 mL via INTRAVENOUS

## 2020-07-14 MED ORDER — NITROGLYCERIN 0.4 MG SL SUBL
0.8000 mg | SUBLINGUAL_TABLET | Freq: Once | SUBLINGUAL | Status: AC
  Administered 2020-07-14: 0.8 mg via SUBLINGUAL

## 2020-07-14 MED ORDER — METOPROLOL TARTRATE 5 MG/5ML IV SOLN: 5.0000 mg | INTRAVENOUS | Status: DC | PRN

## 2020-07-14 MED ORDER — METOPROLOL TARTRATE 5 MG/5ML IV SOLN
INTRAVENOUS | Status: AC
  Filled 2020-07-14: qty 5

## 2020-07-14 MED ORDER — NITROGLYCERIN 0.4 MG SL SUBL
SUBLINGUAL_TABLET | SUBLINGUAL | Status: AC
  Filled 2020-07-14: qty 2

## 2020-07-14 NOTE — Research (Signed)
Subject Name: Amanda Mccullough  Subject met inclusion and exclusion criteria.  The informed consent form, study requirements and expectations were reviewed with the subject and questions and concerns were addressed prior to the signing of the consent form.  The subject verbalized understanding of the trial requirements.  The subject agreed to participate in the Identify trial and signed the informed consent at 1340 on 07/14/20  The informed consent was obtained prior to performance of any protocol-specific procedures for the subject.  A copy of the signed informed consent was given to the subject and a copy was placed in the subject's medical record.   Amanda Mccullough

## 2020-07-15 ENCOUNTER — Telehealth: Payer: Self-pay | Admitting: *Deleted

## 2020-07-15 DIAGNOSIS — K769 Liver disease, unspecified: Secondary | ICD-10-CM

## 2020-07-15 NOTE — Telephone Encounter (Signed)
-----   Message from Christell Constant, MD sent at 07/15/2020  2:08 PM EST ----- Results: No evidence of CAD Incidental 5 cm lesion int he liver possible hemangioma Plan: Will send results to Primary PA Will get RUQ [place] and send results to primary PA as well  Christell Constant, MD

## 2020-07-15 NOTE — Telephone Encounter (Signed)
Patient notified.  Order placed for ultrasound to be done at [facility]. Results routed to patient's PCP

## 2020-07-28 ENCOUNTER — Ambulatory Visit
Admission: RE | Admit: 2020-07-28 | Discharge: 2020-07-28 | Disposition: A | Discharge status: Home / Self Care | Payer: BC Managed Care – PPO | Source: Ambulatory Visit | Attending: Internal Medicine | Admitting: Internal Medicine

## 2020-07-28 DIAGNOSIS — K769 Liver disease, unspecified: Secondary | ICD-10-CM

## 2020-09-13 ENCOUNTER — Other Ambulatory Visit: Payer: Self-pay

## 2020-09-20 NOTE — Progress Notes (Signed)
[place] OFFICE  [address] [place], [place] [address]     Bridget Gomez    Date of Visit:  12/08/2015  Date of Birth: 1955/09/04  Age: 65 yrs.   Medical Record Number: [id number]  Referring Physician: Allena Katz MD, ASHESH D.  __   CURRENT DIAGNOSES     1. Abnormal Heart Sounds, 785.3  2. Chest Pain, Unspecified, 786.50  3. Hypertension (essential or benign or malignant), I10  4. Palpitations, 785.1  __   ALLERGIES    Codeine, Intolerance-unknown  __   MEDICATIONS     1. propranolol 20 mg tablet, 1 po qd  2. trazodone 100 mg tablet, 1 po qhs  3. Flonase Allergy Relief 50 mcg/actuation nasal spray,suspension, as directed  4. trazodone  50 mg tablet, 1/2 tab po  __  CHIEF COMPLAINT/REASON FOR VISIT  Followup of Abnormal Heart Sounds, Followup of Chest Pain, Unspecified and Followup  of Palpitations  __  HISTORY OF PRESENT ILLNESS  Bridget Gomez presents for the first time since 2014. At that time, she had presented with chest pain.  She had an unremarkable stress echocardiogram. She is doing well. She has had a number of orthopedic injuries, which have limited her activity, but she denies any chest pain or shortness of breath. She had recent labs, which showed a mild increase in  her LDL, which puts her at a 3.4% of 10-year cardiovascular risk. She takes propranolol for hypertension. She denies any chest pain or shortness of breath with activity.    She has questions regarding initiating exercise regimen and diet.   __  PAST HISTORY     Past Medical Illnesses:  Fibroid tumors, Endometriosis, Non-allergic rhinitis;  Past Cardiac Illnesses: No previous history of  cardiac disease.; Infectious Diseases: Chicken Pox, Mumps; Surgical Procedures : Myomectomy x2 1991, 2001, Tonsillectomy, Breast Biopsy; Trauma History: Sprain of the neck and back 2013, Concussion 2011;  Cardiology Procedures-Invasive: No previous interventional or invasive cardiology procedures.; Cardiology Procedures-Noninvasive : Stress  Echocardiogram December 2010  ___  FAMILY HISTORY   Father -- Hypertension, Prostate cancer  Mother -- Hypertension     __  CARDIAC RISK FACTORS     Tobacco Abuse: used to smoke, but quit;  Family History of Heart Disease: positive; Hyperlipidemia: negative;  Hypertension: positive;  Diabetes Mellitus: negative;  Prior History of Heart Disease: negative; Obesity: positive, BMI25 (Over weight);  Sedentary Life Style:positive; JWJ:[id number]; Menopausal :biological menopause  __  SOCIAL HISTORY    Alcohol Use : drinks occasionally and socially; Smoking: used to smoke, but quit; Former smoker [phone]); Diet : Regular diet and Caffeine use-1-2 per day; Exercise: No regular exercise;   __  REVIEW OF SYSTEMS     General: weight gain; Integumentary:  Denies any change in hair or nails, rashes, or skin lesions.; Eyes: wears eye glasses/contact lenses;  Ears, Nose, Throat, Mouth: Denies any hearing loss, epistaxis, hoarseness or difficulty speaking.;Respiratory : sleep apnea; Cardiovascular: chest discomfort, Painful  cramping or sharp pains in the hips, thighs, or calves when walking, climbing stairs, or exercising; Abdominal  : Denies ulcer disease, hematochezia or melena.;Musculoskeletal:history of arthritis, loss of strength;  Neurological : headaches; Psychiatric:  Denies any history of depression, substance abuse or change in cognitive functions.; Endocrine: Denies  any history of weight change, heat/cold intolerance, polydipsia, or polyuria; Hematologic/Immunologic:  Denies any food allergies, seasonal allergies, bleeding disorders.  __  PHYSICAL EXAMINATION     Vital Signs:  Blood Pressure:  136/86 Sitting, Right arm, regular  cuff  138/86 Sitting, Left arm,  regular cuff    Weight: 148.00 lbs.  Height: 62"   BMI: 27   Pulse: 73/min.        Constitutional: Cooperative, alert and oriented,well developed, well nourished, in no acute distress. Skin:  Warm and dry to touch, no apparent skin lesions, or masses noted. Head:  Normocephalic, normal hair pattern,  no masses or tenderness Eyes: EOMS Intact, PERRL, conjunctivae and lids unremarkable. Funduscopic exam and visual fields not performed.  ENT: Ears, Nose and throat reveal no gross abnormalities. No pallor or cyanosis. Dentition good. Neck : No palpable masses or adenopathy, no thyromegaly, no JVD, carotid pulses are full and equal bilaterally without bruits. Chest : Normal symmetry, no tenderness to palpation, normal respiratory excursion, no intercostal retraction, no use of accessory muscles, normal diaphragmatic excursion, clear to auscultation and percussion.  Cardiac: Regular rhythm, S1 normal, S2 normal, No S3 or S4, Apical impulse not displaced, no murmurs, gallops or rubs detected. Abdomen : Abdomen soft, bowel sounds normoactive, no masses, no hepatosplenomegaly, non-tender, no bruits Peripheral Pulses:  The femoral, popliteal, dorsalis pedis, and posterior tibial pulses are full and equal bilaterally with no bruits auscultated. Extremities/Back: No deformities,  clubbing, cyanosis, erythema or edema observed. There are no spinal abnormalities noted. Normal muscle strength and tone. Neurological:  No gross motor or sensory deficits noted, affect appropriate, oriented to time, person and place.   __    Medications added today by the physician:     IMPRESSIONS:   1. Hypertension.  2. History of chest pain with normal stress echocardiogram, no recent chest pain.  3. Somewhat limited activity.  4. Mild elevation of LDL.     PLAN:   1. Continue propranolol for hypertension.  2. Discussed diet and exercise at length. Recommended interval training, alternating   with either strength training or yoga. Barring any change  in symptoms, I will see   her again in two years.    Rogelio Seen, MD, [facility]     Tid: [id number]:[id number]    cc: Sammuel Hines MD     jf  ____________________________  Christianne Dolin  Patient Electronic Access Today  12 Lead ECG Today  Diet mgmt edu, guidance and  counseling  TODAY  Return Visit 30 MIN 2 years

## 2020-09-21 ENCOUNTER — Ambulatory Visit (INDEPENDENT_AMBULATORY_CARE_PROVIDER_SITE_OTHER): Payer: BC Managed Care – PPO | Admitting: Internal Medicine

## 2020-09-21 ENCOUNTER — Other Ambulatory Visit: Payer: Self-pay

## 2020-09-21 ENCOUNTER — Encounter: Payer: Self-pay | Admitting: Internal Medicine

## 2020-09-21 VITALS — BP 140/90 | HR 90 | Ht 62.0 in | Wt 156.0 lb

## 2020-09-21 DIAGNOSIS — E782 Mixed hyperlipidemia: Secondary | ICD-10-CM | POA: Diagnosis not present

## 2020-09-21 NOTE — Progress Notes (Signed)
Cardiology Office Note:    Date:  09/21/2020   ID:  Amanda Mccullough, DOB 12/23/1955, MRN [id number]  PCP:  Collene Mares, PA  [facility] Cardiologist:  Riley Lam MD [facility] Electrophysiologist:  None   Referring MD: Hyacinth Meeker, [place], PA  CC: shortness of breath F/u  History of Present Illness:    Amanda Mccullough is a 65 y.o. female with a hx of HTN, HLD, IDA who presents for evaluation 06/15/20.  In interim of this visit, patient CCTA and [place] in follow up.  Patient notes that she is doing OK.  Since last visit notes that she didn't get the MRI.  Relevant interval testing or therapy include elevated BP today .  There are no interval hospital/ED visit.    No chest pain or pressure .  No SOB and DOE has improved and no PND/Orthopnea (thinks this may have been stress related given her mother's medical issues.  No weight gain or leg swelling.  No palpitations or syncope . Hasn't done a marathon since 2008.  Ambulatory blood pressure 126/80.   Past Medical History:  Diagnosis Date  . Hyperlipidemia   . Hypertension   . Iron deficiency anemia   . SOB (shortness of breath)     Past Surgical History:  Procedure Laterality Date  . MYEMECTOMY      Current Medications: Current Meds  Medication Sig  . Ferrous Sulfate Dried (FERROUS SULFATE CR PO) Take 65 mg by mouth daily.  . fluticasone (FLONASE) 50 MCG/ACT nasal spray Place 1-2 sprays into both nostrils as needed.  . Misc Natural Products (OSTEO BI-FLEX TRIPLE STRENGTH PO) Take by mouth daily.  . Multiple Vitamins-Minerals (CENTRUM SILVER 50+WOMEN PO) Take 1 tablet by mouth daily in the afternoon.  . NON FORMULARY Take 1 tablet by mouth daily at 12 noon. Nature Bounty 24 hr. Immune support  . propranolol (INDERAL) 20 MG tablet Take 20 mg by mouth daily.     Allergies:   Codeine   Social History   Socioeconomic History  . Marital status: Single    Spouse name: Not on file  . Number of children: Not on  file  . Years of education: Not on file  . Highest education level: Not on file  Occupational History  . Not on file  Tobacco Use  . Smoking status: Never Smoker  . Smokeless tobacco: Never Used  Substance and Sexual Activity  . Alcohol use: Not on file  . Drug use: Not on file  . Sexual activity: Not on file  Other Topics Concern  . Not on file  Social History Narrative  . Not on file   Social Determinants of Health   Financial Resource Strain: Not on file  Food Insecurity: Not on file  Transportation Needs: Not on file  Physical Activity: Not on file  Stress: Not on file  Social Connections: Not on file     Family History: The patient's family history includes Cancer - Prostate in her father; Glaucoma in her mother and sister; Heart disease in her sister; Hypertension in her father, mother, sister, sister, and sister; Thyroid disease in her mother and sister. History of coronary artery disease notable for no members. History of heart failure notable for sister with CHF. History of arrhythmia notable for no members.  ROS:   Please see the history of present illness.    Notes leg pain and slight limitations in mobility All other systems reviewed and are negative.  EKGs/Labs/Other Studies Reviewed:  The following studies were reviewed today:  EKG:   06/15/20 Sinus Rhythm rate 68 WNL  Cardiac: Date: 07/14/20 Results: IMPRESSION: 1. Coronary calcium score of 0. This was 0 percentile for age and sex matched control.  2.  Normal coronary origin with left dominance.  3.  No evidence of CAD.  4.  Consider non atherosclerotic causes of chest pain.  5. 5 cm lesion within the liver with apparent early peripheral puddling of contrast suggesting hemangioma. This could be confirmed with hepatic protocol MRI or right upper quadrant ultrasound.  07/28/20 Follow up US 1. At least 2 echogenic masses in the right lobe of the liver. The larger is heterogeneous and  cannot be characterized as a definite hemangioma. The smaller is more diffusely echogenic and most likely represents an hemangioma. However, echogenic metastases or other masses cannot be excluded. 2. Irregular cystic lesion in the lower pole of the right kidney with some internal echoes and possible peripheral nodularity and septations. This has indeterminate ultrasound features.  RECOMMENDATION: Pre and postcontrast magnetic resonance imaging of the abdomen to include the liver and kidneys.  Recent Labs: 06/15/2020: BUN 16; Creatinine, Ser 0.78; NT-Pro BNP 57; Potassium 4.8; Sodium 141  Recent Lipid Panel No results found for: CHOL, TRIG, HDL, CHOLHDL, VLDL, LDLCALC, LDLDIRECT  OSH Labs 04/15/20 Hgb 10.7 PLT 270 Cholesterol 235 Trig 220 LDL 141 HDL 55  Risk Assessment/Calculations:     N/A  Physical Exam:    VS:  BP 140/90   Pulse 90   Ht 5\' 2"  (1.575 m)   Wt 156 lb (70.8 kg)   SpO2 98%   BMI 28.53 kg/m     Wt Readings from Last 3 Encounters:  09/21/20 156 lb (70.8 kg)  06/15/20 156 lb 6.4 oz (70.9 kg)    GEN:  Well nourished, well developed in no acute distress HEENT: Normal NECK: No JVD; No carotid bruits LYMPHATICS: No lymphadenopathy CARDIAC: RRR, no murmurs, rubs, gallops RESPIRATORY:  Clear to auscultation without rales, wheezing or rhonchi  ABDOMEN: Soft, non-tender, non-distended MUSCULOSKELETAL:  No edema; No deformity  SKIN: Warm and dry NEUROLOGIC:  Alert and oriented x 3 PSYCHIATRIC:  Normal affect   ASSESSMENT:    1. Mixed hyperlipidemia    PLAN:    In order of problems listed above:  Hyperlipidemia (mixed) -LDL goal less than 100 - No CAC and Atherosclerosis - Shared Decision Making: will recheck lipid profile LFTs and try medications at that time - gave education on dietary changes at length  Incidental finding on Liver -Sees PCP today and is getting MRI - will we re-send ultrasound records to our [facility] follow  up unless new symptoms or abnormal test results warranting change in plan  Would be reasonable for  Video Visit Follow up Would be reasonable for  APP Follow up  Medication Adjustments/Labs and Tests Ordered: Current medicines are reviewed at length with the patient today.  Concerns regarding medicines are outlined above.  Orders Placed This Encounter  Procedures  . Lipid panel   No orders of the defined types were placed in this encounter.   Patient Instructions  Medication Instructions:  Your physician recommends that you continue on your current medications as directed. Please refer to the Current Medication list given to you today.  *If you need a refill on your cardiac medications before your next appointment, please call your pharmacy*   Lab Work: IN 5 MONTHS: Fasting lipid panel If you have labs (blood work) drawn  today and your tests are completely normal, you will receive your results only by: Marland Kitchen MyChart Message (if you have MyChart) OR . A paper copy in the mail If you have any lab test that is abnormal or we need to change your treatment, we will call you to review the results.   Testing/Procedures: NONE   Follow-Up: At [facility], you and your health needs are our priority.  As part of our continuing mission to provide you with exceptional heart care, we have created designated Provider Care Teams.  These Care Teams include your primary Cardiologist (physician) and Advanced Practice Providers (APPs -  Physician Assistants and Nurse Practitioners) who all work together to provide you with the care you need, when you need it.  We recommend signing up for the patient portal called "MyChart".  Sign up information is provided on this After Visit Summary.  MyChart is used to connect with patients for Virtual Visits (Telemedicine).  Patients are able to view lab/test results, encounter notes, upcoming appointments, etc.  Non-urgent messages can be sent to your provider as  well.   To learn more about what you can do with MyChart, go to [email or web address].    Your next appointment:   5 month(s)  The format for your next appointment:   In Person  Provider:   You may see Riley Lam, MD or one of the following Advanced Practice Providers on your designated Care Team:    Ronie Spies, PA-C  Jacolyn Reedy, PA-C          Signed, Christell Constant, MD  09/21/2020 11:35 AM    [facility]

## 2020-09-21 NOTE — Patient Instructions (Signed)
Medication Instructions:  Your physician recommends that you continue on your current medications as directed. Please refer to the Current Medication list given to you today.  *If you need a refill on your cardiac medications before your next appointment, please call your pharmacy*   Lab Work: IN 5 MONTHS: Fasting lipid panel If you have labs (blood work) drawn today and your tests are completely normal, you will receive your results only by: Marland Kitchen MyChart Message (if you have MyChart) OR . A paper copy in the mail If you have any lab test that is abnormal or we need to change your treatment, we will call you to review the results.   Testing/Procedures: NONE   Follow-Up: At [facility], you and your health needs are our priority.  As part of our continuing mission to provide you with exceptional heart care, we have created designated Provider Care Teams.  These Care Teams include your primary Cardiologist (physician) and Advanced Practice Providers (APPs -  Physician Assistants and Nurse Practitioners) who all work together to provide you with the care you need, when you need it.  We recommend signing up for the patient portal called "MyChart".  Sign up information is provided on this After Visit Summary.  MyChart is used to connect with patients for Virtual Visits (Telemedicine).  Patients are able to view lab/test results, encounter notes, upcoming appointments, etc.  Non-urgent messages can be sent to your provider as well.   To learn more about what you can do with MyChart, go to [email or web address].    Your next appointment:   5 month(s)  The format for your next appointment:   In Person  Provider:   You may see Riley Lam, MD or one of the following Advanced Practice Providers on your designated Care Team:    Ronie Spies, PA-C  Jacolyn Reedy, PA-C

## 2020-09-28 ENCOUNTER — Other Ambulatory Visit: Payer: Self-pay | Admitting: Internal Medicine

## 2020-10-03 ENCOUNTER — Ambulatory Visit: Payer: BC Managed Care – PPO | Admitting: Family Medicine

## 2020-10-06 ENCOUNTER — Other Ambulatory Visit (HOSPITAL_COMMUNITY): Payer: Self-pay | Admitting: Internal Medicine

## 2020-10-06 ENCOUNTER — Other Ambulatory Visit: Payer: Self-pay | Admitting: Internal Medicine

## 2020-10-06 DIAGNOSIS — R160 Hepatomegaly, not elsewhere classified: Secondary | ICD-10-CM

## 2020-10-12 ENCOUNTER — Ambulatory Visit (INDEPENDENT_AMBULATORY_CARE_PROVIDER_SITE_OTHER): Payer: BC Managed Care – PPO | Admitting: Family Medicine

## 2020-10-12 ENCOUNTER — Other Ambulatory Visit: Payer: Self-pay

## 2020-10-12 ENCOUNTER — Encounter: Payer: Self-pay | Admitting: Family Medicine

## 2020-10-12 DIAGNOSIS — E782 Mixed hyperlipidemia: Secondary | ICD-10-CM | POA: Insufficient documentation

## 2020-10-12 DIAGNOSIS — M545 Low back pain, unspecified: Secondary | ICD-10-CM | POA: Diagnosis not present

## 2020-10-12 DIAGNOSIS — G8929 Other chronic pain: Secondary | ICD-10-CM | POA: Diagnosis not present

## 2020-10-12 DIAGNOSIS — M5430 Sciatica, unspecified side: Secondary | ICD-10-CM | POA: Insufficient documentation

## 2020-10-12 DIAGNOSIS — I10 Essential (primary) hypertension: Secondary | ICD-10-CM | POA: Insufficient documentation

## 2020-10-12 DIAGNOSIS — D509 Iron deficiency anemia, unspecified: Secondary | ICD-10-CM | POA: Insufficient documentation

## 2020-10-12 MED ORDER — BACLOFEN 10 MG PO TABS: 5.0000 mg | ORAL_TABLET | Freq: Every evening | ORAL | 3 refills | Status: DC | PRN

## 2020-10-12 NOTE — Progress Notes (Signed)
Office Visit Note   Patient: Amanda Mccullough           Date of Birth: Oct 03, 1955           MRN: [id number] Visit Date: 10/12/2020 Requested by: Collene Mares, PA [address] [place],  [place] [address] PCP: Hyacinth Meeker, [place], [place]  Subjective: Chief Complaint  Patient presents with  . Lower Back - Pain    Pain starting in the right lower back/buttock region and down the posterior thigh to the knee. Has had this for years, but has been worse over the last couple of months. Now, for the last month she is having the same pain on the left side. No numbness/tingling in the legs/feet. Pain is worse with lying down and with sitting a long time.  Has started using Aspercreme with Lidocaine twice a day and some Aleve once a day if needed. These do help.    HPI: She is here with her right greater than left posterior hip and leg pain.  Symptoms originally started 4 or 5 years ago while living in [place].  She went to an orthopedist and had some x-rays done she thinks, and then went to physical therapy a few times but had to stop due to expensive co-pays.  Eventually her pain went away on its own but then several months ago the pain started to come back again.  This time it is more on the right side but now is affecting her left side.  It bothers her most when standing and walking, when transitioning from sitting to standing, and when turning over in bed.  Denies any weakness or numbness in her extremities, denies any bowel or bladder dysfunction.  Recently she has been taking Aleve along with topical Aspercreme with lidocaine and her pain seems to be improving.               ROS:   All other systems were reviewed and are negative.  Objective: Vital Signs: There were no vitals taken for this visit.  Physical Exam:  General:  Alert and oriented, in no acute distress. Pulm:  Breathing unlabored. Psy:  Normal mood, congruent affect.  Low back: No tenderness to palpation of the lumbar  spine.  She does have a some tenderness in the right greater than left gluteus medius area.  Good range of motion and no significant pain with passive internal hip rotation.  Patrick's test is equivocal.  Straight leg raise is negative.  Lower extremity strength and reflexes are normal.  Imaging: No results found.  Assessment & Plan: 1.  Right greater than left posterior hip pain, possibilities include lumbar foraminal stenosis, piriformis syndrome -Elected to try physical therapy.  If she fails to improve, then x-rays and possibly MRI scan.  Baclofen as needed.     Procedures: No procedures performed        PMFS History: Patient Active Problem List   Diagnosis Date Noted  . Essential hypertension 10/12/2020  . Iron deficiency anemia 10/12/2020  . Mixed hyperlipidemia 10/12/2020  . Sciatica 10/12/2020  . DOE (dyspnea on exertion) 06/15/2020  . Insomnia 03/10/2013   Past Medical History:  Diagnosis Date  . Hyperlipidemia   . Hypertension   . Iron deficiency anemia   . SOB (shortness of breath)     Family History  Problem Relation Age of Onset  . Hypertension Mother   . Thyroid disease Mother   . Glaucoma Mother   . Hypertension Father   .  Cancer - Prostate Father   . Hypertension Sister   . Heart disease Sister   . Thyroid disease Sister   . Glaucoma Sister   . Hypertension Sister   . Hypertension Sister     Past Surgical History:  Procedure Laterality Date  . MYEMECTOMY     Social History   Occupational History  . Not on file  Tobacco Use  . Smoking status: Never Smoker  . Smokeless tobacco: Never Used  Substance and Sexual Activity  . Alcohol use: Not on file  . Drug use: Not on file  . Sexual activity: Not on file

## 2020-10-14 ENCOUNTER — Telehealth: Payer: Self-pay | Admitting: Family Medicine

## 2020-10-14 NOTE — Telephone Encounter (Signed)
10/12/20 ov note faxed to [facility] [phone]

## 2020-10-16 DIAGNOSIS — D1800 Hemangioma unspecified site: Secondary | ICD-10-CM

## 2020-10-16 HISTORY — DX: Hemangioma unspecified site: D18.00

## 2020-10-24 ENCOUNTER — Ambulatory Visit (HOSPITAL_COMMUNITY)
Admission: RE | Admit: 2020-10-24 | Discharge: 2020-10-24 | Disposition: A | Discharge status: Home / Self Care | Payer: BC Managed Care – PPO | Source: Ambulatory Visit | Attending: Internal Medicine | Admitting: Internal Medicine

## 2020-10-24 ENCOUNTER — Other Ambulatory Visit: Payer: Self-pay

## 2020-10-24 DIAGNOSIS — R160 Hepatomegaly, not elsewhere classified: Secondary | ICD-10-CM | POA: Insufficient documentation

## 2020-10-24 MED ORDER — GADOBUTROL 1 MMOL/ML IV SOLN
7.0000 mL | Freq: Once | INTRAVENOUS | Status: AC | PRN
  Administered 2020-10-24: 7 mL via INTRAVENOUS

## 2020-10-27 ENCOUNTER — Telehealth: Payer: Self-pay | Admitting: *Deleted

## 2020-10-27 DIAGNOSIS — Z006 Encounter for examination for normal comparison and control in clinical research program: Secondary | ICD-10-CM

## 2020-10-27 NOTE — Telephone Encounter (Signed)
I called patient for 90-day Identify phone call. I left message for patient to call me. I will send e-mail  to patient. 

## 2020-11-21 ENCOUNTER — Telehealth: Payer: Self-pay

## 2020-11-21 DIAGNOSIS — Z006 Encounter for examination for normal comparison and control in clinical research program: Secondary | ICD-10-CM

## 2020-11-21 NOTE — Telephone Encounter (Signed)
I called patient for her 90-day Identify Study follow up phone call. Patient is doing well with no cardiac symptoms at this time. I reminded patient I would call her in January for her 1 year follow-up. 

## 2021-02-27 ENCOUNTER — Ambulatory Visit: Payer: BC Managed Care – PPO | Admitting: Internal Medicine

## 2021-02-27 NOTE — Progress Notes (Deleted)
Cardiology Office Note:    Date:  02/27/2021   ID:  Amanda Mccullough, DOB Apr 16, 1956, MRN [id number]  PCP:  Collene Mares, PA  [facility] Cardiologist:  Riley Lam MD [facility] Electrophysiologist:  None   Referring MD: Hyacinth Meeker, [place], [place]   CC: Follow up HLD  History of Present Illness:    Amanda Mccullough is a 65 y.o. female with a hx of HTN, HLD, IDA who presents for evaluation 06/15/20.  In interim of this visit, patient CCTA and [place] in follow up.  Had MRI showing hepatic hemangiomas but no worrisome follow up.  Seen 02/27/21.  *** how is your mother?  Patient notes that she is doing ***.  Since day prior/last visit notes *** changes.  Relevant interval testing or therapy include ***.  There are no*** interval hospital/ED visit.    No chest pain or pressure ***.  No SOB/DOE*** and no PND/Orthopnea***.  No weight gain or leg swelling***.  No palpitations or syncope ***.  Ambulatory blood pressure ***.    Past Medical History:  Diagnosis Date   Hyperlipidemia    Hypertension    Iron deficiency anemia    SOB (shortness of breath)     Past Surgical History:  Procedure Laterality Date   MYEMECTOMY      Current Medications: No outpatient medications have been marked as taking for the 02/27/21 encounter (Appointment) with Christell Constant, MD.     Allergies:   Codeine   Social History   Socioeconomic History   Marital status: Single    Spouse name: Not on file   Number of children: Not on file   Years of education: Not on file   Highest education level: Not on file  Occupational History   Not on file  Tobacco Use   Smoking status: Never   Smokeless tobacco: Never  Substance and Sexual Activity   Alcohol use: Not on file   Drug use: Not on file   Sexual activity: Not on file  Other Topics Concern   Not on file  Social History Narrative   Not on file   Social Determinants of Health   Financial Resource Strain: Not on file  Food  Insecurity: Not on file  Transportation Needs: Not on file  Physical Activity: Not on file  Stress: Not on file  Social Connections: Not on file     Family History: The patient's family history includes Cancer - Prostate in her father; Glaucoma in her mother and sister; Heart disease in her sister; Hypertension in her father, mother, sister, sister, and sister; Thyroid disease in her mother and sister. History of coronary artery disease notable for no members. History of heart failure notable for sister with CHF. History of arrhythmia notable for no members.  ROS:   Please see the history of present illness.    Notes leg pain and slight limitations in mobility All other systems reviewed and are negative.  EKGs/Labs/Other Studies Reviewed:    The following studies were reviewed today:  EKG:   06/15/20 Sinus Rhythm rate 68 WNL  Cardiac: Date: 07/14/20 Results: IMPRESSION: 1. Coronary calcium score of 0. This was 0 percentile for age and sex matched control.   2.  Normal coronary origin with left dominance.   3.  No evidence of CAD.   4.  Consider non atherosclerotic causes of chest pain.   5. 5 cm lesion within the liver with apparent early peripheral puddling of contrast suggesting hemangioma. This could be  confirmed with hepatic protocol MRI or right upper quadrant ultrasound.  07/28/20 Follow up US 1. At least 2 echogenic masses in the right lobe of the liver. The larger is heterogeneous and cannot be characterized as a definite hemangioma. The smaller is more diffusely echogenic and most likely represents an hemangioma. However, echogenic metastases or other masses cannot be excluded. 2. Irregular cystic lesion in the lower pole of the right kidney with some internal echoes and possible peripheral nodularity and septations. This has indeterminate ultrasound features.   RECOMMENDATION: Pre and postcontrast magnetic resonance imaging of the abdomen to include the  liver and kidneys.  Recent Labs: 06/15/2020: BUN 16; Creatinine, Ser 0.78; NT-Pro BNP 57; Potassium 4.8; Sodium 141  Recent Lipid Panel No results found for: CHOL, TRIG, HDL, CHOLHDL, VLDL, LDLCALC, LDLDIRECT  OSH Labs 04/15/20 Hgb 10.7 PLT 270 Cholesterol 235 Trig 220 LDL 141 HDL 55  Risk Assessment/Calculations:     N/A  Physical Exam:    VS:  There were no vitals taken for this visit.    Wt Readings from Last 3 Encounters:  09/21/20 156 lb (70.8 kg)  06/15/20 156 lb 6.4 oz (70.9 kg)    GEN:  Well nourished, well developed in no acute distress HEENT: Normal NECK: No JVD; No carotid bruits LYMPHATICS: No lymphadenopathy CARDIAC: RRR, no murmurs, rubs, gallops RESPIRATORY:  Clear to auscultation without rales, wheezing or rhonchi  ABDOMEN: Soft, non-tender, non-distended MUSCULOSKELETAL:  No edema; No deformity  SKIN: Warm and dry NEUROLOGIC:  Alert and oriented x 3 PSYCHIATRIC:  Normal affect   ASSESSMENT:    No diagnosis found.  PLAN:    In order of problems listed above:  Hyperlipidemia (mixed) -LDL goal less than 100 - No CAC and Atherosclerosis - Shared Decision Making: will recheck lipid profile LFTs*** - gave education on dietary changes at length  Incidental finding on Liver -Sees PCP today and is getting MRI - will we re-send ultrasound records to our [facility]  Will plan for *** follow up unless new symptoms or abnormal test results warranting change in plan  Would be reasonable for *** APP Follow up   Medication Adjustments/Labs and Tests Ordered: Current medicines are reviewed at length with the patient today.  Concerns regarding medicines are outlined above.  No orders of the defined types were placed in this encounter.  No orders of the defined types were placed in this encounter.   There are no Patient Instructions on file for this visit.   Signed, Christell Constant, MD  02/27/2021 7:56 AM    [facility]

## 2021-03-01 ENCOUNTER — Ambulatory Visit: Payer: BC Managed Care – PPO | Admitting: Internal Medicine

## 2021-06-05 NOTE — Progress Notes (Signed)
Cardiology Office Note:    Date:  06/08/2021   ID:  Amanda Mccullough, DOB 06/01/1956, MRN [id number]  PCP:  Collene Mares, PA  [facility] Cardiologist:  Riley Lam MD [facility] Electrophysiologist:  None   Referring MD: Hyacinth Meeker, [place], [place]   CC: Shortness of breath F/u  History of Present Illness:    Amanda Mccullough is a 65 y.o. female with a hx of HTN, HLD, IDA who presents for evaluation 06/15/20.  In interim of this visit, patient CCTA and [place] and MRI without suspicious findings. Seen 06/05/21.  Patient notes that she is doing OK.  Taking care of her 35 year old mother, and her animals.  Still hasn't gotten outside and do much because of her mother and the COVID-19 pandemic. Since day prior/last visit notes that all of her tests looked well . There are no interval hospital/ED visit.    No chest pain or pressure .  No SOB/DOE and no PND/Orthopnea.  No weight gain or leg swelling.  No palpitations or syncope .  Ambulatory blood pressure not done.   Past Medical History:  Diagnosis Date   Hyperlipidemia    Hypertension    Iron deficiency anemia    SOB (shortness of breath)     Past Surgical History:  Procedure Laterality Date   MYEMECTOMY      Current Medications: Current Meds  Medication Sig   Ferrous Sulfate Dried (FERROUS SULFATE CR PO) Take 65 mg by mouth daily.   fluticasone (FLONASE) 50 MCG/ACT nasal spray Place 1-2 sprays into both nostrils as needed.   Multiple Vitamins-Minerals (CENTRUM SILVER 50+WOMEN PO) Take 1 tablet by mouth daily in the afternoon.   NON FORMULARY Take 1 tablet by mouth daily at 12 noon. Nature Bounty 24 hr. Immune support   propranolol (INDERAL) 20 MG tablet Take 20 mg by mouth daily.     Allergies:   Codeine   Social History   Socioeconomic History   Marital status: Single    Spouse name: Not on file   Number of children: Not on file   Years of education: Not on file   Highest education level: Not on file   Occupational History   Not on file  Tobacco Use   Smoking status: Never   Smokeless tobacco: Never  Substance and Sexual Activity   Alcohol use: Not on file   Drug use: Not on file   Sexual activity: Not on file  Other Topics Concern   Not on file  Social History Narrative   Not on file   Social Determinants of Health   Financial Resource Strain: Not on file  Food Insecurity: Not on file  Transportation Needs: Not on file  Physical Activity: Not on file  Stress: Not on file  Social Connections: Not on file    Social: has mother who is ~ 17  Family History: The patient's family history includes Cancer - Prostate in her father; Glaucoma in her mother and sister; Heart disease in her sister; Hypertension in her father, mother, sister, sister, and sister; Thyroid disease in her mother and sister. History of coronary artery disease notable for no members. History of heart failure notable for sister with CHF. History of arrhythmia notable for no members.  ROS:   Please see the history of present illness.    Notes leg pain and slight limitations in mobility All other systems reviewed and are negative.  EKGs/Labs/Other Studies Reviewed:    The following studies were reviewed  today:  EKG:   06/08/21: SR rate 90 06/15/20 Sinus Rhythm rate 68 WNL  Cardiac: Date: 07/14/20 Results: IMPRESSION: 1. Coronary calcium score of 0. This was 0 percentile for age and sex matched control.   2.  Normal coronary origin with left dominance.   3.  No evidence of CAD.   4.  Consider non atherosclerotic causes of chest pain.   5. 5 cm lesion within the liver with apparent early peripheral puddling of contrast suggesting hemangioma. This could be confirmed with hepatic protocol MRI or right upper quadrant ultrasound.  07/28/20 Follow up US 1. At least 2 echogenic masses in the right lobe of the liver. The larger is heterogeneous and cannot be characterized as a definite hemangioma.  The smaller is more diffusely echogenic and most likely represents an hemangioma. However, echogenic metastases or other masses cannot be excluded. 2. Irregular cystic lesion in the lower pole of the right kidney with some internal echoes and possible peripheral nodularity and septations. This has indeterminate ultrasound features.   RECOMMENDATION: Pre and postcontrast magnetic resonance imaging of the abdomen to include the liver and kidneys; MRI showed hemagiomas.  Recent Labs: 06/15/2020: BUN 16; Creatinine, Ser 0.78; NT-Pro BNP 57; Potassium 4.8; Sodium 141  Recent Lipid Panel No results found for: CHOL, TRIG, HDL, CHOLHDL, VLDL, LDLCALC, LDLDIRECT  OSH Labs 04/15/20 Hgb 10.7 PLT 270 Cholesterol 235 Trig 220 LDL 141 HDL 55   Physical Exam:    VS:  BP 138/90    Pulse 90    Ht 5\' 2"  (1.575 m)    Wt 68.9 kg    SpO2 98%    BMI 27.80 kg/m     Wt Readings from Last 3 Encounters:  06/08/21 68.9 kg  09/21/20 70.8 kg  06/15/20 70.9 kg    Gen: no distress  Neck: No JVD,  Ears: No Homero Fellers Sign Cardiac: No Rubs or Gallops, no Murmur, regular, +2 radial pulses Respiratory: Clear to auscultation bilaterally, normal effort, normal  respiratory rate GI: Soft, nontender, non-distended  MS: No  edema;  moves all extremities Integument: Skin feels warm Neuro:  At time of evaluation, alert and oriented to person/place/time/situation  Psych: Normal affect, patient feels OK   ASSESSMENT:    1. Essential hypertension   2. Mixed hyperlipidemia   3. History of concussion     PLAN:    Hypertension  Hyperlipidemia (mixed) Prior concussion- with elevated BP after -LDL goal less than 100 - No CAC and Atherosclerosis - lipids today - PCP has used propranolol for BP - if elevated AMB BP would HCTZ 12.5 mg PO (will start amb meetings) - we discussed diet and exercise interventions.  One year with me    Medication Adjustments/Labs and Tests Ordered: Current medicines are  reviewed at length with the patient today.  Concerns regarding medicines are outlined above.  No orders of the defined types were placed in this encounter.  No orders of the defined types were placed in this encounter.   There are no Patient Instructions on file for this visit.   Signed, Christell Constant, MD  06/08/2021 9:16 AM    [facility]

## 2021-06-08 ENCOUNTER — Encounter: Payer: Self-pay | Admitting: Internal Medicine

## 2021-06-08 ENCOUNTER — Ambulatory Visit (INDEPENDENT_AMBULATORY_CARE_PROVIDER_SITE_OTHER): Payer: BC Managed Care – PPO | Admitting: Internal Medicine

## 2021-06-08 ENCOUNTER — Other Ambulatory Visit: Payer: Self-pay

## 2021-06-08 VITALS — BP 138/90 | HR 90 | Ht 62.0 in | Wt 152.0 lb

## 2021-06-08 DIAGNOSIS — D508 Other iron deficiency anemias: Secondary | ICD-10-CM

## 2021-06-08 DIAGNOSIS — Z87820 Personal history of traumatic brain injury: Secondary | ICD-10-CM | POA: Insufficient documentation

## 2021-06-08 DIAGNOSIS — E782 Mixed hyperlipidemia: Secondary | ICD-10-CM | POA: Diagnosis not present

## 2021-06-08 DIAGNOSIS — I10 Essential (primary) hypertension: Secondary | ICD-10-CM | POA: Diagnosis not present

## 2021-06-08 LAB — LIPID PANEL
Chol/HDL Ratio: 3.8 ratio (ref 0.0–4.4)
Cholesterol, Total: 218 mg/dL — ABNORMAL HIGH (ref 100–199)
HDL: 58 mg/dL (ref >39)
LDL Chol Calc (NIH): 133 mg/dL — ABNORMAL HIGH (ref 0–99)
Triglycerides: 155 mg/dL — ABNORMAL HIGH (ref 0–149)
VLDL Cholesterol Cal: 27 mg/dL (ref 5–40)

## 2021-06-08 NOTE — Patient Instructions (Signed)
Medication Instructions:  Your physician recommends that you continue on your current medications as directed. Please refer to the Current Medication list given to you today.  *If you need a refill on your cardiac medications before your next appointment, please call your pharmacy*   Lab Work: TODAY: lipid panel If you have labs (blood work) drawn today and your tests are completely normal, you will receive your results only by: MyChart Message (if you have MyChart) OR A paper copy in the mail If you have any lab test that is abnormal or we need to change your treatment, we will call you to review the results.   Testing/Procedures: NONE   Follow-Up: At [facility], you and your health needs are our priority.  As part of our continuing mission to provide you with exceptional heart care, we have created designated Provider Care Teams.  These Care Teams include your primary Cardiologist (physician) and Advanced Practice Providers (APPs -  Physician Assistants and Nurse Practitioners) who all work together to provide you with the care you need, when you need it.  Your next appointment:   12 month(s)  The format for your next appointment:   In Person  Provider:   Christell Constant, MD

## 2021-06-09 ENCOUNTER — Telehealth: Payer: Self-pay

## 2021-06-09 DIAGNOSIS — E782 Mixed hyperlipidemia: Secondary | ICD-10-CM

## 2021-06-09 MED ORDER — ROSUVASTATIN CALCIUM 5 MG PO TABS: 5.0000 mg | ORAL_TABLET | Freq: Every day | ORAL | 3 refills | Status: DC

## 2021-06-09 NOTE — Telephone Encounter (Signed)
The patient has been notified of the result and verbalized understanding.  All questions (if any) were answered. Theresia Majors, RN 06/09/2021 12:09 PM  Rx has been sent in. Lab work has been scheduled.

## 2021-06-09 NOTE — Telephone Encounter (Signed)
-----   Message from Christell Constant, MD sent at 06/09/2021 12:02 PM EST ----- Results: LDL above goal Plan: Rosuvastatin 5 mg PO daily and labs in 3 months  Christell Constant, MD

## 2021-06-27 ENCOUNTER — Encounter: Payer: Self-pay | Admitting: Internal Medicine

## 2021-06-27 DIAGNOSIS — I10 Essential (primary) hypertension: Secondary | ICD-10-CM

## 2021-06-28 NOTE — Telephone Encounter (Signed)
Left a message for pt to call back

## 2021-06-30 MED ORDER — HYDROCHLOROTHIAZIDE 12.5 MG PO CAPS
12.5000 mg | ORAL_CAPSULE | Freq: Every day | ORAL | 3 refills | Status: DC
Start: 2021-06-30 — End: 2022-06-07

## 2021-06-30 NOTE — Telephone Encounter (Signed)
Called pt reviewed MD recommendation to start HCTZ 12.5 mg PO QD and f/u labs in 2 weeks.  Pt will come in on 07/17/21 for labs.  All questions answered.

## 2021-07-17 ENCOUNTER — Other Ambulatory Visit: Payer: Self-pay

## 2021-07-17 ENCOUNTER — Other Ambulatory Visit: Payer: BC Managed Care – PPO | Admitting: *Deleted

## 2021-07-17 DIAGNOSIS — E782 Mixed hyperlipidemia: Secondary | ICD-10-CM

## 2021-07-17 DIAGNOSIS — I10 Essential (primary) hypertension: Secondary | ICD-10-CM

## 2021-07-18 LAB — LIPID PANEL
Chol/HDL Ratio: 2.8 ratio (ref 0.0–4.4)
Cholesterol, Total: 157 mg/dL (ref 100–199)
HDL: 56 mg/dL (ref >39)
LDL Chol Calc (NIH): 65 mg/dL (ref 0–99)
Triglycerides: 222 mg/dL — ABNORMAL HIGH (ref 0–149)
VLDL Cholesterol Cal: 36 mg/dL (ref 5–40)

## 2021-07-18 LAB — BASIC METABOLIC PANEL
BUN/Creatinine Ratio: 17 (ref 12–28)
BUN: 16 mg/dL (ref 8–27)
CO2: 27 mmol/L (ref 20–29)
Calcium: 9.9 mg/dL (ref 8.7–10.3)
Chloride: 99 mmol/L (ref 96–106)
Creatinine, Ser: 0.95 mg/dL (ref 0.57–1.00)
Glucose: 99 mg/dL (ref 70–99)
Potassium: 4.3 mmol/L (ref 3.5–5.2)
Sodium: 138 mmol/L (ref 134–144)
eGFR: 67 mL/min/{1.73_m2} (ref >59)

## 2021-09-05 ENCOUNTER — Other Ambulatory Visit: Payer: BC Managed Care – PPO | Admitting: *Deleted

## 2021-09-05 ENCOUNTER — Other Ambulatory Visit: Payer: Self-pay

## 2021-09-05 DIAGNOSIS — E782 Mixed hyperlipidemia: Secondary | ICD-10-CM

## 2021-09-05 LAB — LIPID PANEL
Chol/HDL Ratio: 2.8 ratio (ref 0.0–4.4)
Cholesterol, Total: 173 mg/dL (ref 100–199)
HDL: 61 mg/dL (ref >39)
LDL Chol Calc (NIH): 83 mg/dL (ref 0–99)
Triglycerides: 169 mg/dL — ABNORMAL HIGH (ref 0–149)
VLDL Cholesterol Cal: 29 mg/dL (ref 5–40)

## 2021-09-05 LAB — ALT: ALT: 19 IU/L (ref 0–32)

## 2021-10-27 DIAGNOSIS — Z0001 Encounter for general adult medical examination with abnormal findings: Secondary | ICD-10-CM | POA: Diagnosis not present

## 2021-10-27 DIAGNOSIS — R739 Hyperglycemia, unspecified: Secondary | ICD-10-CM | POA: Diagnosis not present

## 2021-10-27 DIAGNOSIS — I10 Essential (primary) hypertension: Secondary | ICD-10-CM | POA: Diagnosis not present

## 2021-11-01 DIAGNOSIS — Z01419 Encounter for gynecological examination (general) (routine) without abnormal findings: Secondary | ICD-10-CM | POA: Diagnosis not present

## 2021-11-01 DIAGNOSIS — Z1231 Encounter for screening mammogram for malignant neoplasm of breast: Secondary | ICD-10-CM | POA: Diagnosis not present

## 2021-11-01 DIAGNOSIS — I10 Essential (primary) hypertension: Secondary | ICD-10-CM | POA: Diagnosis not present

## 2021-11-01 DIAGNOSIS — Z780 Asymptomatic menopausal state: Secondary | ICD-10-CM | POA: Diagnosis not present

## 2022-01-08 IMAGING — CT CT HEART MORP W/ CTA COR W/ SCORE W/ CA W/CM &/OR W/O CM
4 of 7 series · 8 of 20 positions shown, 9 images · IV contrast (APPLIED)
Comparison: None.
COMPARISON: None.

Addendum:
EXAM:
OVER-READ INTERPRETATION  CT CHEST

The following report is an over-read performed by radiologist Dr.
Edwige Vardhan [[email or web address]] on 07/14/2020. This over-read
does not include interpretation of cardiac or coronary anatomy or
pathology. The coronary CTA interpretation by the cardiologist is
attached.
TECHNIQUE: The patient was scanned on a Phillips Force scanner.

[Series 6: best diast 76 % · axial · 0.39mm/px · z∈[+11,+50]mm · 2 of 298 slices shown]
[im 100/298  vessel]
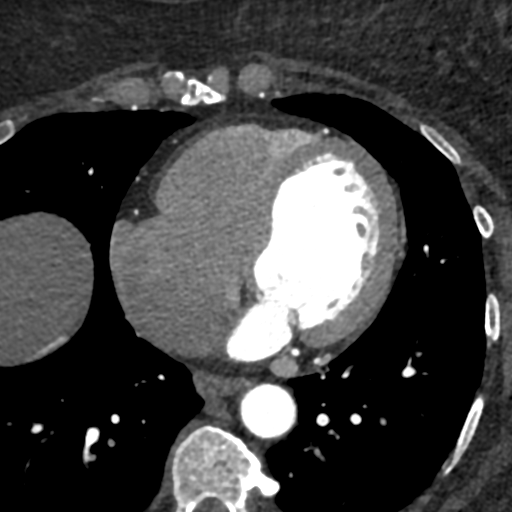
[im 199/298  vessel]
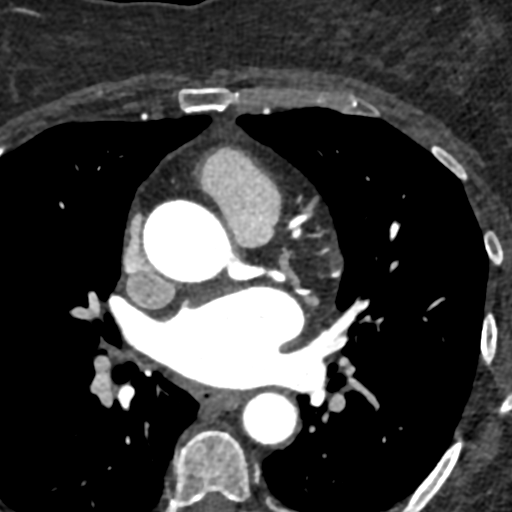

[Series 7: best syst · axial · 0.39mm/px · z∈[+11,+50]mm · 2 of 298 slices shown, 3 images]
[im 100/298  vessel]
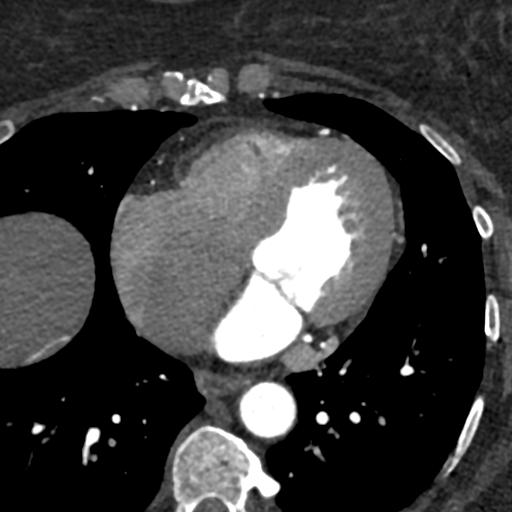
[im 100/298  lung]
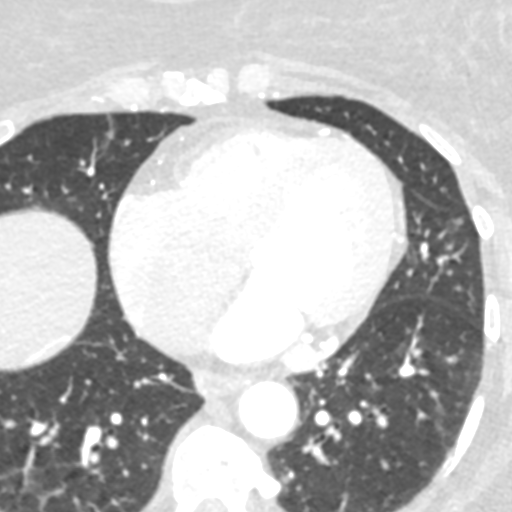
[im 199/298  vessel]
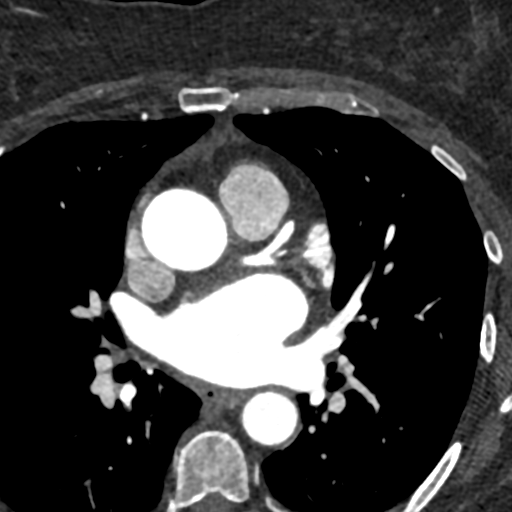

[Series 8: ts diast sharp 76 % · axial · 0.39mm/px · z∈[+11,+50]mm · 2 of 298 slices shown]
[im 100/298  lung]
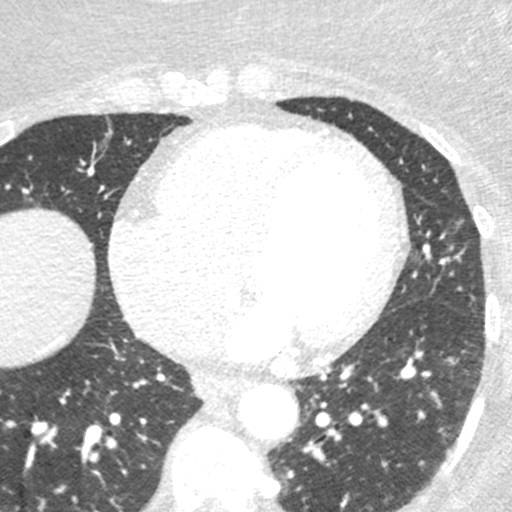
[im 199/298  lung]
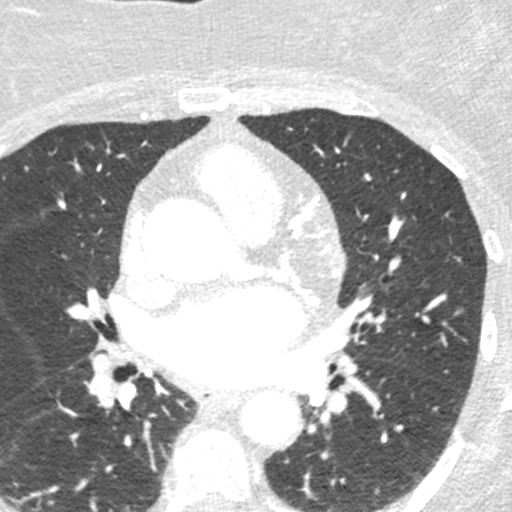

[Series 9: ts syst sharp · axial · 0.39mm/px · z∈[+11,+50]mm · 2 of 298 slices shown]
[im 100/298  lung]
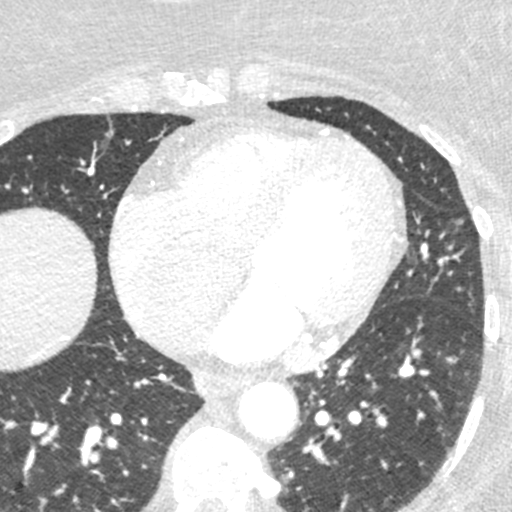
[im 199/298  lung]
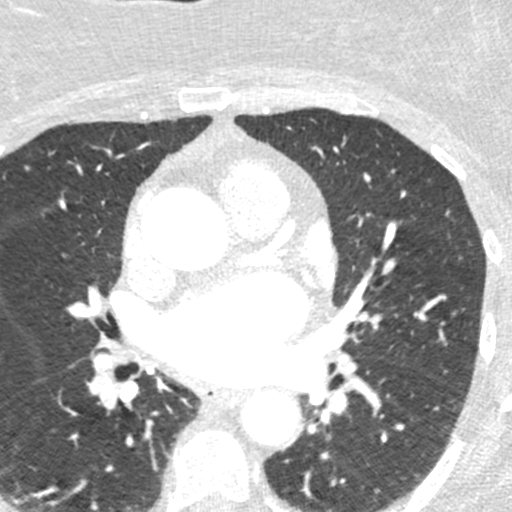

[8 of 20 positions shown; findings below may reference images not displayed]

FINDINGS: Vascular: Heart is normal size. Aorta normal caliber. Scattered
calcifications in the aortic arch.

Mediastinum/Nodes: No adenopathy

Lungs/Pleura: No confluent opacities or effusions.

Upper Abdomen: Low-density lesion within the right hepatic lobe
measures up to 5 cm. There appears to be early peripheral puddling
of contrast suggesting this is a hemangioma.

Musculoskeletal: Chest wall soft tissues are unremarkable. No acute
bony abnormality.
IMPRESSION: 5 cm lesion within the liver with apparent early peripheral puddling
of contrast suggesting hemangioma. This could be confirmed with
hepatic protocol MRI or right upper quadrant ultrasound.

EXAM:
Cardiac/Coronary  CT
FINDINGS: A 120 kV prospective scan was triggered in the descending thoracic
aorta at 111 HU's. Axial non-contrast 3 mm slices were carried out
through the heart. The data set was analyzed on a dedicated work
station and scored using the Agatson method. Gantry rotation speed
was 250 msecs and collimation was .6 mm. No beta blockade and 0.8 mg
of sl NTG was given. The 3D data set was reconstructed in 5%
intervals of the 67-82 % of the R-R cycle. Diastolic phases were
analyzed on a dedicated work station using MPR, MIP and VRT modes.
The patient received 80 cc of contrast.

Aorta:  Normal size.  No calcifications.  No dissection.

Aortic Valve:  Trileaflet.  No calcifications.

Coronary Arteries:  Normal coronary origin.  Left dominance.

RCA is a small non dominant artery that gives rise to a large
branching acute RV marginal vessel. There is no plaque.

Left main is a large artery that gives rise to LAD and LCX arteries.
There is no plaque.

LAD is a large vessel that gives rise to a moderate sized diagaonal.
There is no plaque.

LCX is a dominant artery that gives rise to moderate OM1 and OM2
branches as well as LPLA and LPDA. There is no plaque.

Other findings:

Normal pulmonary vein drainage into the left atrium.

Normal let atrial appendage without a thrombus.

Normal size of the pulmonary artery.
IMPRESSION: 1. Coronary calcium score of 0. This was 0 percentile for age and
sex matched control.

2.  Normal coronary origin with left dominance.

3.  No evidence of CAD.

4.  Consider non atherosclerotic causes of chest pain.

Chaddrick Odriscoll

*** End of Addendum ***
EXAM:
OVER-READ INTERPRETATION  CT CHEST

The following report is an over-read performed by radiologist Dr.
Edwige Vardhan [[email or web address]] on 07/14/2020. This over-read
does not include interpretation of cardiac or coronary anatomy or
pathology. The coronary CTA interpretation by the cardiologist is
attached.
FINDINGS: Vascular: Heart is normal size. Aorta normal caliber. Scattered
calcifications in the aortic arch.

Mediastinum/Nodes: No adenopathy

Lungs/Pleura: No confluent opacities or effusions.

Upper Abdomen: Low-density lesion within the right hepatic lobe
measures up to 5 cm. There appears to be early peripheral puddling
of contrast suggesting this is a hemangioma.

Musculoskeletal: Chest wall soft tissues are unremarkable. No acute
bony abnormality.
IMPRESSION: 5 cm lesion within the liver with apparent early peripheral puddling
of contrast suggesting hemangioma. This could be confirmed with
hepatic protocol MRI or right upper quadrant ultrasound.

## 2022-02-08 DIAGNOSIS — H40013 Open angle with borderline findings, low risk, bilateral: Secondary | ICD-10-CM | POA: Diagnosis not present

## 2022-03-08 DIAGNOSIS — R7303 Prediabetes: Secondary | ICD-10-CM | POA: Diagnosis not present

## 2022-05-17 DIAGNOSIS — J069 Acute upper respiratory infection, unspecified: Secondary | ICD-10-CM | POA: Diagnosis not present

## 2022-05-17 DIAGNOSIS — Z03818 Encounter for observation for suspected exposure to other biological agents ruled out: Secondary | ICD-10-CM | POA: Diagnosis not present

## 2022-06-07 ENCOUNTER — Other Ambulatory Visit: Payer: Self-pay | Admitting: Internal Medicine

## 2022-06-20 ENCOUNTER — Ambulatory Visit: Payer: BC Managed Care – PPO | Attending: Internal Medicine | Admitting: Internal Medicine

## 2022-06-20 ENCOUNTER — Encounter: Payer: Self-pay | Admitting: Internal Medicine

## 2022-06-20 VITALS — BP 126/80 | HR 81 | Ht 62.0 in | Wt 152.2 lb

## 2022-06-20 DIAGNOSIS — E782 Mixed hyperlipidemia: Secondary | ICD-10-CM | POA: Diagnosis not present

## 2022-06-20 DIAGNOSIS — D508 Other iron deficiency anemias: Secondary | ICD-10-CM | POA: Diagnosis not present

## 2022-06-20 DIAGNOSIS — I10 Essential (primary) hypertension: Secondary | ICD-10-CM | POA: Diagnosis not present

## 2022-06-20 MED ORDER — HYDROCHLOROTHIAZIDE 12.5 MG PO CAPS: 12.5000 mg | ORAL_CAPSULE | Freq: Every day | ORAL | 3 refills | Status: DC

## 2022-06-20 NOTE — Progress Notes (Signed)
Cardiology Office Note:    Date:  06/20/2022   ID:  Amanda Mccullough, DOB 1956-05-31, MRN [id number]  PCP:  Collene Mares, PA  [facility] Cardiologist:  Riley Lam MD [facility] Electrophysiologist:  None   Referring MD: Hyacinth Meeker, [place] E, PA   CC: BP f/u  History of Present Illness:    Amanda Mccullough is a 67 y.o. female with a hx of HTN, HLD, IDA who presents for evaluation 06/15/20.   2021: patient CCTA and [place] and MRI without suspicious findings.  2022: started HCTZ  Patient notes that she is doing well.   Took her 33 year old mom to [place] for thanksgiving and she did great. There are no interval hospital/ED visit.    No chest pain or pressure .  No SOB/DOE and no PND/Orthopnea.  No weight gain or leg swelling (lost a couple of pounds).  No palpitations or syncope.  Her exercise is limited because she has been focusing on her mother's care.  Past Medical History:  Diagnosis Date   Hyperlipidemia    Hypertension    Iron deficiency anemia    SOB (shortness of breath)     Past Surgical History:  Procedure Laterality Date   MYEMECTOMY      Current Medications: Current Meds  Medication Sig   Ferrous Sulfate Dried (FERROUS SULFATE CR PO) Take 65 mg by mouth daily.   fluticasone (FLONASE) 50 MCG/ACT nasal spray Place 1-2 sprays into both nostrils as needed.   Multiple Vitamins-Minerals (CENTRUM SILVER 50+WOMEN PO) Take 1 tablet by mouth daily in the afternoon.   NON FORMULARY Take 1 tablet by mouth daily at 12 noon. Nature Bounty 24 hr. Immune support   propranolol (INDERAL) 20 MG tablet Take 20 mg by mouth daily.   rosuvastatin (CRESTOR) 5 MG tablet TAKE 1 TABLET (5 MG TOTAL) BY MOUTH DAILY.   [DISCONTINUED] hydrochlorothiazide (MICROZIDE) 12.5 MG capsule TAKE 1 CAPSULE BY MOUTH EVERY DAY     Allergies:   Codeine   Social History   Socioeconomic History   Marital status: Single    Spouse name: Not on file   Number of children: Not on file   Years  of education: Not on file   Highest education level: Not on file  Occupational History   Not on file  Tobacco Use   Smoking status: Never   Smokeless tobacco: Never  Substance and Sexual Activity   Alcohol use: Not on file   Drug use: Not on file   Sexual activity: Not on file  Other Topics Concern   Not on file  Social History Narrative   Not on file   Social Determinants of Health   Financial Resource Strain: Not on file  Food Insecurity: Not on file  Transportation Needs: Not on file  Physical Activity: Not on file  Stress: Not on file  Social Connections: Not on file    Social: has mother who is ~ 95 going on 48.  She is retired.  She is from [place]  Family History: The patient's family history includes Cancer - Prostate in her father; Glaucoma in her mother and sister; Heart disease in her sister; Hypertension in her father, mother, sister, sister, and sister; Thyroid disease in her mother and sister. History of coronary artery disease notable for no members. History of heart failure notable for sister with CHF. History of arrhythmia notable for no members.  ROS:   Please see the history of present illness.    All  other systems reviewed and are negative.  EKGs/Labs/Other Studies Reviewed:    The following studies were reviewed today:  EKG:   06/20/22: SR rate 80s 06/08/21: SR rate 90 06/15/20 Sinus Rhythm rate 68 WNL Cardiac Studies & Procedures          CT SCANS  CT CORONARY MORPH W/CTA COR W/SCORE 07/14/2020  Addendum 07/14/2020  3:56 PM ADDENDUM REPORT: 07/14/2020 15:54  EXAM: Cardiac/Coronary  CT  TECHNIQUE: The patient was scanned on a [employer].  FINDINGS: A 120 kV prospective scan was triggered in the descending thoracic aorta at 111 HU's. Axial non-contrast 3 mm slices were carried out through the heart. The data set was analyzed on a dedicated work station and scored using the Agatson method. Gantry rotation speed was 250 msecs and  collimation was .6 mm. No beta blockade and 0.8 mg of sl NTG was given. The 3D data set was reconstructed in 5% intervals of the 67-82 % of the R-R cycle. Diastolic phases were analyzed on a dedicated work station using MPR, MIP and VRT modes. The patient received 80 cc of contrast.  Aorta:  Normal size.  No calcifications.  No dissection.  Aortic Valve:  Trileaflet.  No calcifications.  Coronary Arteries:  Normal coronary origin.  Left dominance.  RCA is a small non dominant artery that gives rise to a large branching acute RV marginal vessel. There is no plaque.  Left main is a large artery that gives rise to LAD and LCX arteries. There is no plaque.  LAD is a large vessel that gives rise to a moderate sized diagaonal. There is no plaque.  LCX is a dominant artery that gives rise to moderate OM1 and OM2 branches as well as LPLA and LPDA. There is no plaque.  Other findings:  Normal pulmonary vein drainage into the left atrium.  Normal let atrial appendage without a thrombus.  Normal size of the pulmonary artery.  IMPRESSION: 1. Coronary calcium score of 0. This was 0 percentile for age and sex matched control.  2.  Normal coronary origin with left dominance.  3.  No evidence of CAD.  4.  Consider non atherosclerotic causes of chest pain.  [name]   Electronically Signed By: [name] On: 07/14/2020 15:54  Narrative EXAM: OVER-READ INTERPRETATION  CT CHEST  The following report is an over-read performed by radiologist Dr. Charlett Nose of [facility] on 07/14/2020. This over-read does not include interpretation of cardiac or coronary anatomy or pathology. The coronary CTA interpretation by the cardiologist is attached.  COMPARISON:  None.  FINDINGS: Vascular: Heart is normal size. Aorta normal caliber. Scattered calcifications in the aortic arch.  Mediastinum/Nodes: No adenopathy  Lungs/Pleura: No confluent opacities or  effusions.  Upper Abdomen: Low-density lesion within the right hepatic lobe measures up to 5 cm. There appears to be early peripheral puddling of contrast suggesting this is a hemangioma.  Musculoskeletal: Chest wall soft tissues are unremarkable. No acute bony abnormality.  IMPRESSION: 5 cm lesion within the liver with apparent early peripheral puddling of contrast suggesting hemangioma. This could be confirmed with hepatic protocol MRI or right upper quadrant ultrasound.  Electronically Signed: By: Charlett Nose M.D. On: 07/14/2020 14:58           Recent Labs: 07/17/2021: BUN 16; Creatinine, Ser 0.95; Potassium 4.3; Sodium 138 09/05/2021: ALT 19  Recent Lipid Panel    Component Value Date/Time   CHOL 173 09/05/2021 0905   TRIG 169 (H)  09/05/2021 0905   HDL 61 09/05/2021 0905   CHOLHDL 2.8 09/05/2021 0905   LDLCALC 83 09/05/2021 0905   Physical Exam:    VS:  BP 126/80   Pulse 81   Ht 5\' 2"  (1.575 m)   Wt 152 lb 3.2 oz (69 kg)   SpO2 98%   BMI 27.84 kg/m     Wt Readings from Last 3 Encounters:  06/20/22 152 lb 3.2 oz (69 kg)  06/08/21 152 lb (68.9 kg)  09/21/20 156 lb (70.8 kg)    Gen: no distress  Neck: No JVD Cardiac: No Rubs or Gallops, no murmur, regular, +2 radial pulses Respiratory: Clear to auscultation bilaterally, normal effort, normal  respiratory rate GI: Soft, nontender, non-distended  MS: No  edema;  moves all extremities Integument: Skin feels warm Neuro:  At time of evaluation, alert and oriented to person/place/time/situation  Psych: Normal affect, patient feels OK   ASSESSMENT:    1. Essential hypertension   2. Mixed hyperlipidemia   3. Other iron deficiency anemia     PLAN:    Hypertension  Hyperlipidemia (mixed) IDA -LDL goal less than 100 - No CAC; will not start primary prevention ASA given IDA; continue statin - Low dose BB with no sx - HCTZ 12.5 mg - we discussed diet and exercise interventions: is exploring [facility] for  2024  One year with me per her request    Medication Adjustments/Labs and Tests Ordered: Current medicines are reviewed at length with the patient today.  Concerns regarding medicines are outlined above.  Orders Placed This Encounter  Procedures   EKG 12-Lead   Meds ordered this encounter  Medications   hydrochlorothiazide (MICROZIDE) 12.5 MG capsule    Sig: Take 1 capsule (12.5 mg total) by mouth daily.    Dispense:  90 capsule    Refill:  3    Patient Instructions  Medication Instructions:  Your physician recommends that you continue on your current medications as directed. Please refer to the Current Medication list given to you today.  *If you need a refill on your cardiac medications before your next appointment, please call your pharmacy*   Lab Work: NONE If you have labs (blood work) drawn today and your tests are completely normal, you will receive your results only by: MyChart Message (if you have MyChart) OR A paper copy in the mail If you have any lab test that is abnormal or we need to change your treatment, we will call you to review the results.   Testing/Procedures: NONE   Follow-Up: At [facility], you and your health needs are our priority.  As part of our continuing mission to provide you with exceptional heart care, we have created designated Provider Care Teams.  These Care Teams include your primary Cardiologist (physician) and Advanced Practice Providers (APPs -  Physician Assistants and Nurse Practitioners) who all work together to provide you with the care you need, when you need it.   Your next appointment:   1 year(s)  The format for your next appointment:   In Person  Provider:   Christell Constant, MD      Important Information About Sugar         Signed, Christell Constant, MD  06/20/2022 10:04 AM    [facility]

## 2022-06-20 NOTE — Patient Instructions (Signed)
Medication Instructions:  Your physician recommends that you continue on your current medications as directed. Please refer to the Current Medication list given to you today.  *If you need a refill on your cardiac medications before your next appointment, please call your pharmacy*   Lab Work: NONE  If you have labs (blood work) drawn today and your tests are completely normal, you will receive your results only by: MyChart Message (if you have MyChart) OR A paper copy in the mail If you have any lab test that is abnormal or we need to change your treatment, we will call you to review the results.   Testing/Procedures: NONE   Follow-Up: At [facility], you and your health needs are our priority.  As part of our continuing mission to provide you with exceptional heart care, we have created designated Provider Care Teams.  These Care Teams include your primary Cardiologist (physician) and Advanced Practice Providers (APPs -  Physician Assistants and Nurse Practitioners) who all work together to provide you with the care you need, when you need it.   Your next appointment:   1 year(s)  The format for your next appointment:   In Person  Provider:   Mahesh A Chandrasekhar, MD      Important Information About Sugar       

## 2022-07-09 ENCOUNTER — Other Ambulatory Visit: Payer: Self-pay | Admitting: Internal Medicine

## 2022-08-07 ENCOUNTER — Ambulatory Visit: Payer: BC Managed Care – PPO | Admitting: Orthopaedic Surgery

## 2022-08-07 ENCOUNTER — Ambulatory Visit (INDEPENDENT_AMBULATORY_CARE_PROVIDER_SITE_OTHER): Payer: BC Managed Care – PPO

## 2022-08-07 ENCOUNTER — Encounter: Payer: Self-pay | Admitting: Orthopaedic Surgery

## 2022-08-07 DIAGNOSIS — M1611 Unilateral primary osteoarthritis, right hip: Secondary | ICD-10-CM

## 2022-08-07 DIAGNOSIS — M19012 Primary osteoarthritis, left shoulder: Secondary | ICD-10-CM

## 2022-08-07 DIAGNOSIS — M25551 Pain in right hip: Secondary | ICD-10-CM

## 2022-08-07 DIAGNOSIS — G8929 Other chronic pain: Secondary | ICD-10-CM | POA: Diagnosis not present

## 2022-08-07 DIAGNOSIS — M25512 Pain in left shoulder: Secondary | ICD-10-CM

## 2022-08-07 MED ORDER — DICLOFENAC SODIUM 75 MG PO TBEC: 75.0000 mg | DELAYED_RELEASE_TABLET | Freq: Two times a day (BID) | ORAL | 0 refills | Status: DC | PRN

## 2022-08-07 MED ORDER — BACLOFEN 10 MG PO TABS: 10.0000 mg | ORAL_TABLET | Freq: Two times a day (BID) | ORAL | 0 refills | Status: DC | PRN

## 2022-08-07 MED ORDER — TRAMADOL HCL 50 MG PO TABS: 50.0000 mg | ORAL_TABLET | Freq: Two times a day (BID) | ORAL | 0 refills | Status: DC | PRN

## 2022-08-07 NOTE — Progress Notes (Signed)
Office Visit Note   Patient: Amanda Mccullough           Date of Birth: Jun 06, 1956           MRN: [id number] Visit Date: 08/07/2022              Requested by: Collene Mares, PA [address] [place],  [place] [address] PCP: Hyacinth Meeker, [place], [place]   Assessment & Plan: Visit Diagnoses:  1. Primary osteoarthritis of right hip   2. Primary osteoarthritis of left shoulder     Plan: Impression is advanced degenerative joint disease right hip and left shoulder.  At this point, we have discussed various treatment options for her hip to include intra-articular cortisone injection versus total hip arthroplasty given the degree of arthritis.  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  She would first like to try cortisone injection while she is trying to arrange care for her mother.  In regards to the shoulder, we have discussed referral to Dr. Shon Baton for intra-articular cortisone injection.  She is agreeable to this plan.  Follow-up as needed.  Follow-Up Instructions: Return if symptoms worsen or fail to improve.   Orders:  Orders Placed This Encounter  Procedures   XR HIP UNILAT W OR W/O PELVIS 2-3 VIEWS RIGHT   XR Lumbar Spine 2-3 Views   XR Shoulder Left   AMB referral to sports medicine   Meds ordered this encounter  Medications   traMADol (ULTRAM) 50 MG tablet    Sig: Take 1 tablet (50 mg total) by mouth every 12 (twelve) hours as needed.    Dispense:  30 tablet    Refill:  0   baclofen (LIORESAL) 10 MG tablet    Sig: Take 1 tablet (10 mg total) by mouth 2 (two) times daily as needed for muscle spasms.    Dispense:  20 each    Refill:  0   diclofenac (VOLTAREN) 75 MG EC tablet    Sig: Take 1 tablet (75 mg total) by mouth 2 (two) times daily as needed.    Dispense:  60 tablet    Refill:  0      Procedures: No procedures performed   Clinical Data: No additional  findings.   Subjective: Chief Complaint  Patient presents with   Right Hip - Pain   Left Shoulder - Pain    HPI patient is a pleasant 67 year old female who comes in today with left shoulder and right hip pain.  In regards to the left shoulder, she notes that she sustained a fall onto her left side back in 2014.  Symptoms did seem to improve with PT.  Over the past year her symptoms have returned and have worsened.  The pain she has is primarily to the deltoid.  She has difficulty with range of motion.  No weakness.  She has been taking Aleve which does seem to help a little.  No previous cortisone injection.  In regards to the right hip, the pain she has is primarily to the buttock and radiates down the lateral leg and into the anterior thigh.  No pain distal to the knee.  No pain in the groin.  She feels as though this is just very stiff.  Symptoms are worse when she steps a certain way as well as with external rotation at the hip.  No previous cortisone injection in the hip.  No previous ESI.  Review of Systems as detailed in HPI.  All others reviewed and are negative.   Objective: Vital Signs: There were no vitals taken for this visit.  Physical Exam well-developed well-nourished female no acute distress.  Alert and oriented x 3.  Ortho Exam left shoulder exam reveals active forward flexion and abduction to approximately 90 degrees.  I can passively get her to about 160 degrees.  Internal rotation to L5.  She has slight pain with resisted external rotation.  Mild pain with empty can test.  No pain with speeds and O'Brien's test.  Lumbar spine exam is unremarkable.  No tenderness to greater trochanter.  She does have pain with logroll and FADIR.  She is neurovascular intact distally.  Specialty Comments:  No specialty comments available.  Imaging: XR Shoulder Left  Result Date: 08/07/2022 Advanced degenerative changes to the glenohumeral and AC joints with multiple peri-articular  osteophytes  XR HIP UNILAT W OR W/O PELVIS 2-3 VIEWS RIGHT  Result Date: 08/07/2022 X-rays demonstrate bone-on-bone joint space narrowing with femoral head spurring.  XR Lumbar Spine 2-3 Views  Result Date: 08/07/2022 X-rays demonstrate multilevel degenerative changes    PMFS History: Patient Active Problem List   Diagnosis Date Noted   History of concussion 06/08/2021   Essential hypertension 10/12/2020   Iron deficiency anemia 10/12/2020   Mixed hyperlipidemia 10/12/2020   Sciatica 10/12/2020   DOE (dyspnea on exertion) 06/15/2020   Insomnia 03/10/2013   Past Medical History:  Diagnosis Date   Hyperlipidemia    Hypertension    Iron deficiency anemia    SOB (shortness of breath)     Family History  Problem Relation Age of Onset   Hypertension Mother    Thyroid disease Mother    Glaucoma Mother    Hypertension Father    Cancer - Prostate Father    Hypertension Sister    Heart disease Sister    Thyroid disease Sister    Glaucoma Sister    Hypertension Sister    Hypertension Sister     Past Surgical History:  Procedure Laterality Date   MYEMECTOMY     Social History   Occupational History   Not on file  Tobacco Use   Smoking status: Never   Smokeless tobacco: Never  Substance and Sexual Activity   Alcohol use: Not on file   Drug use: Not on file   Sexual activity: Not on file

## 2022-08-08 DIAGNOSIS — H2513 Age-related nuclear cataract, bilateral: Secondary | ICD-10-CM | POA: Diagnosis not present

## 2022-08-08 DIAGNOSIS — H25012 Cortical age-related cataract, left eye: Secondary | ICD-10-CM | POA: Diagnosis not present

## 2022-08-08 DIAGNOSIS — H16223 Keratoconjunctivitis sicca, not specified as Sjogren's, bilateral: Secondary | ICD-10-CM | POA: Diagnosis not present

## 2022-08-08 DIAGNOSIS — H40013 Open angle with borderline findings, low risk, bilateral: Secondary | ICD-10-CM | POA: Diagnosis not present

## 2022-08-09 ENCOUNTER — Encounter: Payer: Self-pay | Admitting: Sports Medicine

## 2022-08-09 ENCOUNTER — Ambulatory Visit: Payer: BC Managed Care – PPO | Admitting: Sports Medicine

## 2022-08-09 ENCOUNTER — Ambulatory Visit: Payer: Self-pay

## 2022-08-09 DIAGNOSIS — M19012 Primary osteoarthritis, left shoulder: Secondary | ICD-10-CM | POA: Diagnosis not present

## 2022-08-09 DIAGNOSIS — M1611 Unilateral primary osteoarthritis, right hip: Secondary | ICD-10-CM

## 2022-08-09 MED ORDER — METHYLPREDNISOLONE ACETATE 40 MG/ML IJ SUSP
80.0000 mg | INTRAMUSCULAR | Status: AC | PRN
  Administered 2022-08-09: 80 mg via INTRA_ARTICULAR

## 2022-08-09 MED ORDER — BUPIVACAINE HCL 0.25 % IJ SOLN
2.0000 mL | INTRAMUSCULAR | Status: AC | PRN
Start: 2022-08-09 — End: 2022-08-09
  Administered 2022-08-09: 2 mL via INTRA_ARTICULAR

## 2022-08-09 MED ORDER — LIDOCAINE HCL 1 % IJ SOLN
2.0000 mL | INTRAMUSCULAR | Status: AC | PRN
  Administered 2022-08-09: 2 mL

## 2022-08-09 MED ORDER — METHYLPREDNISOLONE ACETATE 40 MG/ML IJ SUSP
40.0000 mg | INTRAMUSCULAR | Status: AC | PRN
  Administered 2022-08-09: 40 mg via INTRA_ARTICULAR

## 2022-08-09 MED ORDER — LIDOCAINE HCL 1 % IJ SOLN
4.0000 mL | INTRAMUSCULAR | Status: AC | PRN
  Administered 2022-08-09: 4 mL

## 2022-08-09 NOTE — Progress Notes (Signed)
Amanda Mccullough - 67 y.o. female MRN [id number]  Date of birth: 23-May-1956  Office Visit Note: Visit Date: 08/09/2022 PCP: Collene Mares, PA Referred by: Collene Mares, PA  Subjective: No chief complaint on file.  HPI: Amanda Mccullough is a pleasant 67 y.o. female who presents today for chronic left shoulder pain and right hip pain with known osteoarthritis.  Ortho pain is her chronic injuries.  She had a fall back on the left side back in 2014 and started some of her pain.  She has been taking Aleve which only helps to a small extent.  Previously saw my partner, Dr. Roda Shutters, we discussed that there trialing ultrasound-guided injections.  I also started her on Voltaren p.o., baclofen as well as tramadol as needed.  Has not yet started these. She reports marked restriction in range of motion of the left shoulder.  Pertinent ROS were reviewed with the patient and found to be negative unless otherwise specified above in HPI.   Assessment & Plan: Visit Diagnoses:  1. Primary osteoarthritis of right hip   2. Primary osteoarthritis of left shoulder    Plan: Discussed with Amanda Mccullough that she has severe osteoarthritis of both the right hip as well as the left shoulder.  She has been managed on oral medications that she may take as needed.  Through shared decision making, elected to proceed with ultrasound-guided left glenohumeral joint injection and then right intra-articular hip injection.  She tolerated these well and had excellent relief of her pain minutes following the anesthetic portion.  We discussed we can always consider repeat injections about 22-month intervals.  I would like her to improve her range of motion of the shoulder as able, I did give her a customized handout for stretching and range of motion she may begin on Sunday.  She will follow-up with Dr. Roda Shutters as indicated; I am happy to see her back as needed.  Follow-up: as needed   Meds & Orders: No orders of the defined types were placed  in this encounter.   Orders Placed This Encounter  Procedures   US Guided Needle Placement - No Linked Charges     Procedures: Large Joint Inj: L glenohumeral on 08/09/2022 4:31 PM Indications: pain Details: 22 G 3.5 in needle, ultrasound-guided posterior approach Medications: 2 mL lidocaine 1 %; 2 mL bupivacaine 0.25 %; 40 mg methylPREDNISolone acetate 40 MG/ML Outcome: tolerated well, no immediate complications  US-guided glenohumeral joint injection, left shoulder After discussion on risks/benefits/indications, informed verbal consent was obtained. A timeout was then performed. The patient was positioned lying lateral recumbent on examination table. The patient's shoulder was prepped with betadine and multiple alcohol swabs and utilizing ultrasound guidance, the patient's glenohumeral joint was identified on ultrasound. Using ultrasound guidance a 22-gauge, 3.5 inch needle with a mixture of 2:2:1 cc's lidocaine:bupivicaine:depomedrol was directed from a lateral to medial direction via in-plane technique into the glenohumeral joint with visualization of appropriate spread of injectate into the joint. Patient tolerated the procedure well without immediate complications.      Procedure, treatment alternatives, risks and benefits explained, specific risks discussed. Consent was given by the patient. Immediately prior to procedure a time out was called to verify the correct patient, procedure, equipment, support staff and site/side marked as required. Patient was prepped and draped in the usual sterile fashion.    Large Joint Inj: R hip joint on 08/09/2022 4:31 PM Indications: pain Details: 22 G 3.5 in needle, ultrasound-guided anterior approach Medications: 4 mL lidocaine  1 %; 80 mg methylPREDNISolone acetate 40 MG/ML Outcome: tolerated well, no immediate complications  Procedure: US-guided intra-articular hip injection, hip After discussion on risks/benefits/indications and informed  verbal consent was obtained, a timeout was performed. Patient was lying supine on exam table. The hip was cleaned with betadine and alcohol swabs. Then utilizing ultrasound guidance, the patient's femoral head and neck junction was identified and subsequently injected with 4:2 lidocaine:depomedrol via an in-plane approach with ultrasound visualization of the injectate administered into the hip joint. Patient tolerated procedure well without immediate complications.  Procedure, treatment alternatives, risks and benefits explained, specific risks discussed. Consent was given by the patient. Immediately prior to procedure a time out was called to verify the correct patient, procedure, equipment, support staff and site/side marked as required. Patient was prepped and draped in the usual sterile fashion.          Clinical History: No specialty comments available.  She reports that she has never smoked. She has never used smokeless tobacco. No results for input(s): "HGBA1C", "LABURIC" in the last 8760 hours.  Objective:    Physical Exam  Gen: Well-appearing, in no acute distress; non-toxic CV: Well-perfused. Warm.  Resp: Breathing unlabored on room air; no wheezing. Psych: Fluid speech in conversation; appropriate affect; normal thought process Neuro: Sensation intact throughout. No gross coordination deficits.   Ortho Exam - Left shoulder: Inspection of the left shoulder demonstrates no overlying redness or swelling.  There is marked restriction in range of motion in all directions. NVI.  - Right hip: Inspection of the right hip manage is no overlying redness or swelling.  There is no tenderness at the greater trochanter Mild tenderness with deep palpation of the anterior hip joint.  There is pain with internal rotation.  Neurovascular intact distally.  Imaging:  XR Shoulder Left Advanced degenerative changes to the glenohumeral and AC joints with  multiple peri-articular osteophytes XR HIP  UNILAT W OR W/O PELVIS 2-3 VIEWS RIGHT X-rays demonstrate bone-on-bone joint space narrowing with femoral head  spurring. XR Lumbar Spine 2-3 Views X-rays demonstrate multilevel degenerative changes   Past Medical/Family/Surgical/Social History: Medications & Allergies reviewed per EMR, new medications updated. Patient Active Problem List   Diagnosis Date Noted   History of concussion 06/08/2021   Essential hypertension 10/12/2020   Iron deficiency anemia 10/12/2020   Mixed hyperlipidemia 10/12/2020   Sciatica 10/12/2020   DOE (dyspnea on exertion) 06/15/2020   Insomnia 03/10/2013   Past Medical History:  Diagnosis Date   Hyperlipidemia    Hypertension    Iron deficiency anemia    SOB (shortness of breath)    Family History  Problem Relation Age of Onset   Hypertension Mother    Thyroid disease Mother    Glaucoma Mother    Hypertension Father    Cancer - Prostate Father    Hypertension Sister    Heart disease Sister    Thyroid disease Sister    Glaucoma Sister    Hypertension Sister    Hypertension Sister    Past Surgical History:  Procedure Laterality Date   MYEMECTOMY     Social History   Occupational History   Not on file  Tobacco Use   Smoking status: Never   Smokeless tobacco: Never  Substance and Sexual Activity   Alcohol use: Not on file   Drug use: Not on file   Sexual activity: Not on file

## 2022-09-04 ENCOUNTER — Other Ambulatory Visit: Payer: Self-pay | Admitting: Physician Assistant

## 2022-11-20 ENCOUNTER — Other Ambulatory Visit (INDEPENDENT_AMBULATORY_CARE_PROVIDER_SITE_OTHER): Payer: Medicare Other

## 2022-11-20 ENCOUNTER — Other Ambulatory Visit: Payer: Self-pay

## 2022-11-20 ENCOUNTER — Ambulatory Visit (INDEPENDENT_AMBULATORY_CARE_PROVIDER_SITE_OTHER): Payer: Medicare Other | Admitting: Orthopaedic Surgery

## 2022-11-20 DIAGNOSIS — M1611 Unilateral primary osteoarthritis, right hip: Secondary | ICD-10-CM | POA: Diagnosis not present

## 2022-11-20 MED ORDER — DICLOFENAC SODIUM 75 MG PO TBEC: 75.0000 mg | DELAYED_RELEASE_TABLET | Freq: Two times a day (BID) | ORAL | 5 refills | Status: DC | PRN

## 2022-11-20 NOTE — Progress Notes (Signed)
Office Visit Note   Patient: Amanda Mccullough           Date of Birth: 06-12-1956           MRN: [id number] Visit Date: 11/20/2022              Requested by: Collene Mares, PA [address] [place],  [place] [address] PCP: Hyacinth Meeker, [place], [place]   Assessment & Plan: Visit Diagnoses:  1. Primary osteoarthritis of right hip     Plan: Impression is severe right hip degenerative joint disease secondary to Osteoarthritis.  Imaging shows bone on bone joint space narrowing.  At this point, conservative treatments fail to provide any significant relief and the pain is severely affecting ADLs and quality of life.  Based on treatment options, the patient has elected to move forward with a hip replacement.  We have discussed the surgical risks that include but are not limited to infection, DVT, leg length discrepancy, numbness, tingling, incomplete relief of pain.  Recovery and prognosis were also reviewed.    Amanda Mccullough met with the patient today to schedule surgery.  Current anticoagulants: No antithrombotic Postop anticoagulation: Aspirin 81 mg Diabetic: No  Prior DVT/PE: No Tobacco use: No Clearances needed for surgery: None Anticipate discharge dispo: home   Follow-Up Instructions: No follow-ups on file.   Orders:  No orders of the defined types were placed in this encounter.  Meds ordered this encounter  Medications   diclofenac (VOLTAREN) 75 MG EC tablet    Sig: Take 1 tablet (75 mg total) by mouth 2 (two) times daily as needed.    Dispense:  60 tablet    Refill:  5      Procedures: No procedures performed   Clinical Data: No additional findings.   Subjective: Chief Complaint  Patient presents with   Right Hip - Pain    HPI Amanda Mccullough is a very pleasant 67 year old female who comes in for follow-up on her right hip DJD.  She did get significant relief from the hip injection but she understands that the relief is only temporary and she is interested in a  more permanent solution. Review of Systems  Constitutional: Negative.   HENT: Negative.    Eyes: Negative.   Respiratory: Negative.    Cardiovascular: Negative.   Endocrine: Negative.   Musculoskeletal: Negative.   Neurological: Negative.   Hematological: Negative.   Psychiatric/Behavioral: Negative.    All other systems reviewed and are negative.    Objective: Vital Signs: There were no vitals taken for this visit.  Physical Exam Vitals and nursing note reviewed.  Constitutional:      Appearance: She is well-developed.  HENT:     Head: Atraumatic.     Nose: Nose normal.  Eyes:     Extraocular Movements: Extraocular movements intact.  Cardiovascular:     Pulses: Normal pulses.  Pulmonary:     Effort: Pulmonary effort is normal.  Abdominal:     Palpations: Abdomen is soft.  Musculoskeletal:     Cervical back: Neck supple.  Skin:    General: Skin is warm.     Capillary Refill: Capillary refill takes less than 2 seconds.  Neurological:     Mental Status: She is alert. Mental status is at baseline.  Psychiatric:        Behavior: Behavior normal.        Thought Content: Thought content normal.        Judgment: Judgment normal.  Ortho Exam Examination of the right hip shows significant pain with any movement of the hip joint. Specialty Comments:  No specialty comments available.  Imaging: No results found.   PMFS History: Patient Active Problem List   Diagnosis Date Noted   History of concussion 06/08/2021   Essential hypertension 10/12/2020   Iron deficiency anemia 10/12/2020   Mixed hyperlipidemia 10/12/2020   Sciatica 10/12/2020   DOE (dyspnea on exertion) 06/15/2020   Insomnia 03/10/2013   Past Medical History:  Diagnosis Date   Hyperlipidemia    Hypertension    Iron deficiency anemia    SOB (shortness of breath)     Family History  Problem Relation Age of Onset   Hypertension Mother    Thyroid disease Mother    Glaucoma Mother     Hypertension Father    Cancer - Prostate Father    Hypertension Sister    Heart disease Sister    Thyroid disease Sister    Glaucoma Sister    Hypertension Sister    Hypertension Sister     Past Surgical History:  Procedure Laterality Date   MYEMECTOMY     Social History   Occupational History   Not on file  Tobacco Use   Smoking status: Never   Smokeless tobacco: Never  Substance and Sexual Activity   Alcohol use: Not on file   Drug use: Not on file   Sexual activity: Not on file

## 2022-11-21 LAB — PREALBUMIN: Prealbumin: 29 mg/dL (ref 17–34)

## 2022-11-21 LAB — HEMOGLOBIN A1C
Hgb A1c MFr Bld: 6 % of total Hgb — ABNORMAL HIGH (ref <5.7)
Mean Plasma Glucose: 126 mg/dL
eAG (mmol/L): 7 mmol/L

## 2022-12-05 ENCOUNTER — Encounter (HOSPITAL_COMMUNITY): Payer: Self-pay

## 2022-12-05 NOTE — Progress Notes (Signed)
Surgical Instructions    Your procedure is scheduled on Monday December 17, 2022.  Report to [facility] Main Entrance "A" at 1:10 P.M., then check in with the Admitting office.  Call this number if you have problems the morning of surgery:  [phone]   If you have any questions prior to your surgery date call [phone]: Open Monday-Friday 8am-4pm If you experience any cold or flu symptoms such as cough, fever, chills, shortness of breath, etc. between now and your scheduled surgery, please notify us at the above number     Remember:  Do not eat after midnight the night before your surgery  You may drink clear liquids until 12:10 the morning of your surgery.   Clear liquids allowed are: Water, Non-Citrus Juices (without pulp), Carbonated Beverages, Clear Tea, Black Coffee ONLY (NO MILK, CREAM OR POWDERED CREAMER of any kind), and Gatorade.    Enhanced Recovery after Surgery for Orthopedics Enhanced Recovery after Surgery is a protocol used to improve the stress on your body and your recovery after surgery.  Patient Instructions  The day of surgery (if you do NOT have diabetes):  Drink ONE (1) Pre-Surgery Clear Ensure by 12:10 p.m  the morning of surgery   This drink was given to you during your hospital  pre-op appointment visit. Nothing else to drink after completing the  Pre-Surgery Clear Ensure.        If you have questions, please contact your surgeon's office.    Take these medicines the morning of surgery with A SIP OF WATER:  propranolol (INDERAL)  rosuvastatin (CRESTOR)   If needed:  fluticasone (FLONASE)  traMADol (ULTRAM)   As of today, STOP taking any Aspirin (unless otherwise instructed by your surgeon) Aleve, Naproxen, Ibuprofen, Motrin, Advil, Goody's, BC's, all herbal medications, fish oil, and all vitamins. This includes diclofenac (VOLTAREN).         Special instructions:    Oral Hygiene is also important to reduce your risk of infection.  Remember - BRUSH  YOUR TEETH THE MORNING OF SURGERY WITH YOUR REGULAR TOOTHPASTE    Pre-operative 5 CHG Bath Instructions   You can play a key role in reducing the risk of infection after surgery. Your skin needs to be as free of germs as possible. You can reduce the number of germs on your skin by washing with CHG (chlorhexidine gluconate) soap before surgery. CHG is an antiseptic soap that kills germs and continues to kill germs even after washing.   DO NOT use if you have an allergy to chlorhexidine/CHG or antibacterial soaps. If your skin becomes reddened or irritated, stop using the CHG and notify one of our RNs at [phone].   Please shower with the CHG soap starting 4 days before surgery using the following schedule:     Please keep in mind the following:  DO NOT shave, including legs and underarms, starting the day of your first shower.   You may shave your face at any point before/day of surgery.  Place clean sheets on your bed the day you start using CHG soap. Use a clean washcloth (not used since being washed) for each shower. DO NOT sleep with pets once you start using the CHG.   CHG Shower Instructions:  If you choose to wash your hair and private area, wash first with your normal shampoo/soap.  After you use shampoo/soap, rinse your hair and body thoroughly to remove shampoo/soap residue.  Turn the water OFF and apply about 3 tablespoons (45 ml)  of CHG soap to a CLEAN washcloth.  Apply CHG soap ONLY FROM YOUR NECK DOWN TO YOUR TOES (washing for 3-5 minutes)  DO NOT use CHG soap on face, private areas, open wounds, or sores.  Pay special attention to the area where your surgery is being performed.  If you are having back surgery, having someone wash your back for you may be helpful. Wait 2 minutes after CHG soap is applied, then you may rinse off the CHG soap.  Pat dry with a clean towel  Put on clean clothes/pajamas   If you choose to wear lotion, please use ONLY the CHG-compatible  lotions on the back of this paper.     Additional instructions for the day of surgery: DO NOT APPLY any lotions, deodorants, cologne, or perfumes.   Put on clean/comfortable clothes.  Brush your teeth.  Ask your nurse before applying any prescription medications to the skin.      CHG Compatible Lotions   Aveeno Moisturizing lotion  Cetaphil Moisturizing Cream  Cetaphil Moisturizing Lotion  Clairol Herbal Essence Moisturizing Lotion, Dry Skin  Clairol Herbal Essence Moisturizing Lotion, Extra Dry Skin  Clairol Herbal Essence Moisturizing Lotion, Normal Skin  Curel Age Defying Therapeutic Moisturizing Lotion with Alpha Hydroxy  Curel Extreme Care Body Lotion  Curel Soothing Hands Moisturizing Hand Lotion  Curel Therapeutic Moisturizing Cream, Fragrance-Free  Curel Therapeutic Moisturizing Lotion, Fragrance-Free  Curel Therapeutic Moisturizing Lotion, Original Formula  Eucerin Daily Replenishing Lotion  Eucerin Dry Skin Therapy Plus Alpha Hydroxy Crme  Eucerin Dry Skin Therapy Plus Alpha Hydroxy Lotion  Eucerin Original Crme  Eucerin Original Lotion  Eucerin Plus Crme Eucerin Plus Lotion  Eucerin TriLipid Replenishing Lotion  Keri Anti-Bacterial Hand Lotion  Keri Deep Conditioning Original Lotion Dry Skin Formula Softly Scented  Keri Deep Conditioning Original Lotion, Fragrance Free Sensitive Skin Formula  Keri Lotion Fast Absorbing Fragrance Free Sensitive Skin Formula  Keri Lotion Fast Absorbing Softly Scented Dry Skin Formula  Keri Original Lotion  Keri Skin Renewal Lotion Keri Silky Smooth Lotion  Keri Silky Smooth Sensitive Skin Lotion  Nivea Body Creamy Conditioning Oil  Nivea Body Extra Enriched Teacher, adult education Moisturizing Lotion Nivea Crme  Nivea Skin Firming Lotion  NutraDerm 30 Skin Lotion  NutraDerm Skin Lotion  NutraDerm Therapeutic Skin Cream  NutraDerm Therapeutic Skin Lotion  ProShield Protective Hand Cream   Provon moisturizing lotion  Do not wear jewelry or makeup. Do not wear lotions, powders, perfumes/cologne or deodorant. Do not shave 48 hours prior to surgery.  Men may shave face and neck. Do not bring valuables to the hospital. Do not wear nail polish, gel polish, artificial nails, or any other type of covering on natural nails (fingers and toes) If you have artificial nails or gel coating that need to be removed by a nail salon, please have this removed prior to surgery. Artificial nails or gel coating may interfere with anesthesia's ability to adequately monitor your vital signs.  [facility] is not responsible for any belongings or valuables.    Do NOT Smoke (Tobacco/Vaping)  24 hours prior to your procedure  If you use a CPAP at night, you may bring your mask for your overnight stay.   Contacts, glasses, hearing aids, dentures or partials may not be worn into surgery, please bring cases for these belongings   For patients admitted to the hospital, discharge time will be determined by your treatment team.   Patients discharged the day of  surgery will not be allowed to drive home, and someone needs to stay with them for 24 hours.   SURGICAL WAITING ROOM VISITATION Patients having surgery or a procedure may have no more than 2 support people in the waiting area - these visitors may rotate.   Children under the age of 91 must have an adult with them who is not the patient. If the patient needs to stay at the hospital during part of their recovery, the visitor guidelines for inpatient rooms apply. Pre-op nurse will coordinate an appropriate time for 1 support person to accompany patient in pre-op.  This support person may not rotate.   Please refer to [email or web address] for the visitor guidelines for Inpatients (after your surgery is over and you are in a regular room).   If you received a COVID test during your pre-op visit, it is  requested that you wear a mask when out in public, stay away from anyone that may not be feeling well, and notify your surgeon if you develop symptoms. If you have been in contact with anyone that has tested positive in the last 10 days, please notify your surgeon.    Please read over the following fact sheets that you were given.

## 2022-12-06 ENCOUNTER — Other Ambulatory Visit (HOSPITAL_COMMUNITY): Payer: Medicare Other

## 2022-12-06 ENCOUNTER — Inpatient Hospital Stay (HOSPITAL_COMMUNITY)
Admission: RE | Admit: 2022-12-06 | Discharge: 2022-12-06 | Disposition: A | Discharge status: Home / Self Care | Payer: Medicare Other | Source: Ambulatory Visit

## 2022-12-07 ENCOUNTER — Encounter (HOSPITAL_COMMUNITY): Payer: Self-pay

## 2022-12-07 NOTE — Progress Notes (Incomplete)
PCP - Evangeline Dakin, PA Cardiologist - Dr Riley Lam  Chest x-ray - n/a EKG - 12/10/22-copy on chart Stress Test - 2014ish ECHO - 2014ish Cardiac Cath - n/a  ICD Pacemaker/Loop - n/a  Sleep Study -  n/a CPAP - none  Diabetes - n/a   ERAS: Clear liquids til 12:10 PM DOS.  Please complete your PRE-SURGERY ENSURE that was provided to you by 12:10 PM the morning of surgery.  Please, if able, drink it in one setting. DO NOT SIP.  Anesthesia review: Yes  STOP now taking any Aspirin (unless otherwise instructed by your surgeon), Aleve, Naproxen, Ibuprofen, Motrin, Advil, Goody's, BC's, all herbal medications, fish oil, Voltaren and all vitamins.   Coronavirus Screening Do you have any of the following symptoms:  Cough yes/no: No Fever (>100.46F)  yes/no: No Runny nose yes/no: No Sore throat yes/no: No Difficulty breathing/shortness of breath  yes/no: No  Have you traveled in the last 14 days and where? yes/no: No  Patient verbalized understanding of instructions that were given to them at the PAT appointment. Patient was also instructed that they will need to review over the PAT instructions again at home before surgery.

## 2022-12-10 ENCOUNTER — Other Ambulatory Visit: Payer: Self-pay

## 2022-12-10 ENCOUNTER — Encounter (HOSPITAL_COMMUNITY)
Admission: RE | Admit: 2022-12-10 | Discharge: 2022-12-10 | Disposition: A | Discharge status: Home / Self Care | Payer: Medicare Other | Source: Ambulatory Visit | Attending: Orthopaedic Surgery | Admitting: Orthopaedic Surgery

## 2022-12-10 ENCOUNTER — Other Ambulatory Visit: Payer: Self-pay | Admitting: Physician Assistant

## 2022-12-10 ENCOUNTER — Encounter (HOSPITAL_COMMUNITY): Payer: Self-pay

## 2022-12-10 VITALS — BP 133/80 | HR 97 | Temp 98.2°F | Resp 17 | Ht 62.0 in | Wt 149.7 lb

## 2022-12-10 DIAGNOSIS — Z01818 Encounter for other preprocedural examination: Secondary | ICD-10-CM | POA: Insufficient documentation

## 2022-12-10 DIAGNOSIS — M1611 Unilateral primary osteoarthritis, right hip: Secondary | ICD-10-CM | POA: Diagnosis not present

## 2022-12-10 HISTORY — DX: Unspecified cataract: H26.9

## 2022-12-10 HISTORY — DX: Prediabetes: R73.03

## 2022-12-10 HISTORY — DX: Unspecified osteoarthritis, unspecified site: M19.90

## 2022-12-10 LAB — CBC
HCT: 32.8 % — ABNORMAL LOW (ref 36.0–46.0)
Hemoglobin: 10.5 g/dL — ABNORMAL LOW (ref 12.0–15.0)
MCH: 26.8 pg (ref 26.0–34.0)
MCHC: 32 g/dL (ref 30.0–36.0)
MCV: 83.7 fL (ref 80.0–100.0)
Platelets: 272 10*3/uL (ref 150–400)
RBC: 3.92 MIL/uL (ref 3.87–5.11)
RDW: 13.9 % (ref 11.5–15.5)
WBC: 6.9 10*3/uL (ref 4.0–10.5)
nRBC: 0 % (ref 0.0–0.2)

## 2022-12-10 LAB — BASIC METABOLIC PANEL
Anion gap: 11 (ref 5–15)
BUN: 18 mg/dL (ref 8–23)
CO2: 23 mmol/L (ref 22–32)
Calcium: 9.3 mg/dL (ref 8.9–10.3)
Chloride: 104 mmol/L (ref 98–111)
Creatinine, Ser: 0.88 mg/dL (ref 0.44–1.00)
GFR, Estimated: >60 mL/min (ref >60)
Glucose, Bld: 99 mg/dL (ref 70–99)
Potassium: 3.5 mmol/L (ref 3.5–5.1)
Sodium: 138 mmol/L (ref 135–145)

## 2022-12-10 LAB — TYPE AND SCREEN
ABO/RH(D): O POS
Antibody Screen: NEGATIVE

## 2022-12-10 LAB — SURGICAL PCR SCREEN
MRSA, PCR: NEGATIVE
Staphylococcus aureus: NEGATIVE

## 2022-12-10 MED ORDER — OXYCODONE-ACETAMINOPHEN 5-325 MG PO TABS: 1.0000 | ORAL_TABLET | Freq: Four times a day (QID) | ORAL | 0 refills | Status: DC | PRN

## 2022-12-10 MED ORDER — ONDANSETRON HCL 4 MG PO TABS: 4.0000 mg | ORAL_TABLET | Freq: Three times a day (TID) | ORAL | 0 refills | Status: DC | PRN

## 2022-12-10 MED ORDER — METHOCARBAMOL 750 MG PO TABS: 750.0000 mg | ORAL_TABLET | Freq: Two times a day (BID) | ORAL | 2 refills | Status: DC | PRN

## 2022-12-10 MED ORDER — ASPIRIN 81 MG PO TBEC: 81.0000 mg | DELAYED_RELEASE_TABLET | Freq: Two times a day (BID) | ORAL | 0 refills | Status: DC

## 2022-12-10 MED ORDER — DOCUSATE SODIUM 100 MG PO CAPS
100.0000 mg | ORAL_CAPSULE | Freq: Every day | ORAL | 2 refills | Status: DC | PRN
Start: 2022-12-10 — End: 2023-03-13

## 2022-12-11 ENCOUNTER — Encounter (HOSPITAL_COMMUNITY): Payer: Self-pay | Admitting: Medical

## 2022-12-11 ENCOUNTER — Encounter (HOSPITAL_COMMUNITY): Payer: Self-pay

## 2022-12-14 MED ORDER — TRANEXAMIC ACID 1000 MG/10ML IV SOLN
2000.0000 mg | INTRAVENOUS | Status: DC
  Filled 2022-12-14: qty 20

## 2022-12-14 NOTE — Progress Notes (Signed)
Patient's daughter voiced understanding of new arrival time of 1015 Monday 12/17/2022 and instructions to finish ERAS drink by 0915.

## 2022-12-16 ENCOUNTER — Encounter: Payer: Self-pay | Admitting: Orthopaedic Surgery

## 2022-12-16 DIAGNOSIS — M1611 Unilateral primary osteoarthritis, right hip: Secondary | ICD-10-CM | POA: Insufficient documentation

## 2022-12-17 ENCOUNTER — Telehealth: Payer: Self-pay

## 2022-12-17 ENCOUNTER — Ambulatory Visit (HOSPITAL_COMMUNITY): Admission: RE | Admit: 2022-12-17 | Payer: Medicare Other | Source: Ambulatory Visit | Admitting: Orthopaedic Surgery

## 2022-12-17 ENCOUNTER — Encounter (HOSPITAL_COMMUNITY): Admission: RE | Payer: Self-pay | Source: Ambulatory Visit

## 2022-12-17 ENCOUNTER — Encounter: Payer: Self-pay | Admitting: Orthopaedic Surgery

## 2022-12-17 DIAGNOSIS — M1611 Unilateral primary osteoarthritis, right hip: Secondary | ICD-10-CM | POA: Insufficient documentation

## 2022-12-17 SURGERY — ARTHROPLASTY, HIP, TOTAL, ANTERIOR APPROACH
Anesthesia: Spinal | Site: Hip | Laterality: Right

## 2022-12-17 NOTE — Telephone Encounter (Signed)
From [facility]....  Amanda Mccullough- 1955/12/06; sx scheduled 7/1; Right total hip arthroplasty;-  (surgery today- 7/1 cancelled) Two questions: 1. has a new surgery date been determined?  2. Can we go ahead and start [facility] PT on her? (to maintain her strength and safety prior to the new sx date and we would continue her [facility] services post op?)

## 2022-12-17 NOTE — Telephone Encounter (Signed)
Let HHPT know. Mailed exercise packet to patient. Left message for patient on the phone.

## 2023-01-01 ENCOUNTER — Encounter: Payer: Medicare Other | Admitting: Physician Assistant

## 2023-03-07 NOTE — Pre-Procedure Instructions (Addendum)
Surgical Instructions   Your procedure is scheduled on March 18, 2023. Report to [facility] Main Entrance "A" at 5:30 A.M., then check in with the Admitting office. Any questions or running late day of surgery: call [phone]  Questions prior to your surgery date: call [phone], Monday-Friday, 8am-4pm. If you experience any cold or flu symptoms such as cough, fever, chills, shortness of breath, etc. between now and your scheduled surgery, please notify us at the above number.     Remember:  Do not eat after midnight the night before your surgery  You may drink clear liquids until 4:15 AM the morning of your surgery.   Clear liquids allowed are: Water, Non-Citrus Juices (without pulp), Carbonated Beverages, Clear Tea, Black Coffee Only (NO MILK, CREAM OR POWDERED CREAMER of any kind), and Gatorade.  Patient Instructions  The night before surgery:  No food after midnight. ONLY clear liquids after midnight  The day of surgery (if you do NOT have diabetes):  Drink ONE (1) Pre-Surgery Clear Ensure by 4:15 AM the morning of surgery. Drink in one sitting. Do not sip.  This drink was given to you during your hospital  pre-op appointment visit.  Nothing else to drink after completing the  Pre-Surgery Clear Ensure.         If you have questions, please contact your surgeon's office.     Take these medicines the morning of surgery with A SIP OF WATER: propranolol (INDERAL)  rosuvastatin (CRESTOR)    May take these medicines IF NEEDED: diphenhydrAMINE (BENADRYL)  docusate sodium (COLACE)  fluticasone (FLONASE) nasal spray  methocarbamol (ROBAXIN-750)  oxyCODONE-acetaminophen (PERCOCET/ROXICET)    One week prior to surgery, STOP taking any Aspirin (unless otherwise instructed by your surgeon) Aleve, Naproxen, Ibuprofen, Motrin, Advil, Goody's, BC's, all herbal medications, fish oil, and non-prescription vitamins. This includes your medication: diclofenac (VOLTAREN) tablet                       Do NOT Smoke (Tobacco/Vaping) for 24 hours prior to your procedure.  If you use a CPAP at night, you may bring your mask/headgear for your overnight stay.   You will be asked to remove any contacts, glasses, piercing's, hearing aid's, dentures/partials prior to surgery. Please bring cases for these items if needed.    Patients discharged the day of surgery will not be allowed to drive home, and someone needs to stay with them for 24 hours.  SURGICAL WAITING ROOM VISITATION Patients may have no more than 2 support people in the waiting area - these visitors may rotate.   Pre-op nurse will coordinate an appropriate time for 1 ADULT support person, who may not rotate, to accompany patient in pre-op.  Children under the age of 63 must have an adult with them who is not the patient and must remain in the main waiting area with an adult.  If the patient needs to stay at the hospital during part of their recovery, the visitor guidelines for inpatient rooms apply.  Please refer to the [facility] website for the visitor guidelines for any additional information.   If you received a COVID test during your pre-op visit  it is requested that you wear a mask when out in public, stay away from anyone that may not be feeling well and notify your surgeon if you develop symptoms. If you have been in contact with anyone that has tested positive in the last 10 days please notify you surgeon.  Pre-operative 5 CHG Bathing Instructions   You can play a key role in reducing the risk of infection after surgery. Your skin needs to be as free of germs as possible. You can reduce the number of germs on your skin by washing with CHG (chlorhexidine gluconate) soap before surgery. CHG is an antiseptic soap that kills germs and continues to kill germs even after washing.   DO NOT use if you have an allergy to chlorhexidine/CHG or antibacterial soaps. If your skin becomes reddened or  irritated, stop using the CHG and notify one of our RNs at [phone].   Please shower with the CHG soap starting 4 days before surgery using the following schedule:     Please keep in mind the following:  DO NOT shave, including legs and underarms, starting the day of your first shower.   You may shave your face at any point before/day of surgery.  Place clean sheets on your bed the day you start using CHG soap. Use a clean washcloth (not used since being washed) for each shower. DO NOT sleep with pets once you start using the CHG.   CHG Shower Instructions:  Wash your face and private area with normal soap. If you choose to wash your hair, wash first with your normal shampoo.  After you use shampoo/soap, rinse your hair and body thoroughly to remove shampoo/soap residue.  Turn the water OFF and apply about 3 tablespoons (45 ml) of CHG soap to a CLEAN washcloth.  Apply CHG soap ONLY FROM YOUR NECK DOWN TO YOUR TOES (washing for 3-5 minutes)  DO NOT use CHG soap on face, private areas, open wounds, or sores.  Pay special attention to the area where your surgery is being performed.  If you are having back surgery, having someone wash your back for you may be helpful. Wait 2 minutes after CHG soap is applied, then you may rinse off the CHG soap.  Pat dry with a clean towel  Put on clean clothes/pajamas   If you choose to wear lotion, please use ONLY the CHG-compatible lotions on the back of this paper.   Additional instructions for the day of surgery: DO NOT APPLY any lotions, deodorants, cologne, or perfumes.   Do not bring valuables to the hospital. [facility] is not responsible for any belongings/valuables. Do not wear nail polish, gel polish, artificial nails, or any other type of covering on natural nails (fingers and toes) Do not wear jewelry or makeup Put on clean/comfortable clothes.  Please brush your teeth.  Ask your nurse before applying any prescription medications to  the skin.     CHG Compatible Lotions   Aveeno Moisturizing lotion  Cetaphil Moisturizing Cream  Cetaphil Moisturizing Lotion  Clairol Herbal Essence Moisturizing Lotion, Dry Skin  Clairol Herbal Essence Moisturizing Lotion, Extra Dry Skin  Clairol Herbal Essence Moisturizing Lotion, Normal Skin  Curel Age Defying Therapeutic Moisturizing Lotion with Alpha Hydroxy  Curel Extreme Care Body Lotion  Curel Soothing Hands Moisturizing Hand Lotion  Curel Therapeutic Moisturizing Cream, Fragrance-Free  Curel Therapeutic Moisturizing Lotion, Fragrance-Free  Curel Therapeutic Moisturizing Lotion, Original Formula  Eucerin Daily Replenishing Lotion  Eucerin Dry Skin Therapy Plus Alpha Hydroxy Crme  Eucerin Dry Skin Therapy Plus Alpha Hydroxy Lotion  Eucerin Original Crme  Eucerin Original Lotion  Eucerin Plus Crme Eucerin Plus Lotion  Eucerin TriLipid Replenishing Lotion  Keri Anti-Bacterial Hand Lotion  Keri Deep Conditioning Original Lotion Dry Skin Formula Softly Scented  Keri Deep Conditioning Original Lotion,  Fragrance Free Sensitive Skin Formula  Keri Lotion Fast Absorbing Fragrance Free Sensitive Skin Formula  Keri Lotion Fast Absorbing Softly Scented Dry Skin Formula  Keri Original Lotion  Keri Skin Renewal Lotion Keri Silky Smooth Lotion  Keri Silky Smooth Sensitive Skin Lotion  Nivea Body Creamy Conditioning Oil  Nivea Body Extra Enriched Lotion  Nivea Body Original Lotion  Nivea Body Sheer Moisturizing Lotion Nivea Crme  Nivea Skin Firming Lotion  NutraDerm 30 Skin Lotion  NutraDerm Skin Lotion  NutraDerm Therapeutic Skin Cream  NutraDerm Therapeutic Skin Lotion  ProShield Protective Hand Cream  Provon moisturizing lotion  Please read over the following fact sheets that you were given.

## 2023-03-08 ENCOUNTER — Encounter (HOSPITAL_COMMUNITY)
Admission: RE | Admit: 2023-03-08 | Discharge: 2023-03-08 | Disposition: A | Discharge status: Home / Self Care | Payer: Medicare Other | Source: Ambulatory Visit | Attending: Orthopaedic Surgery | Admitting: Orthopaedic Surgery

## 2023-03-08 ENCOUNTER — Other Ambulatory Visit: Payer: Self-pay

## 2023-03-08 ENCOUNTER — Encounter (HOSPITAL_COMMUNITY): Payer: Self-pay

## 2023-03-08 VITALS — BP 119/79 | HR 73 | Temp 98.5°F | Resp 18 | Ht 64.0 in | Wt 148.4 lb

## 2023-03-08 DIAGNOSIS — Z01812 Encounter for preprocedural laboratory examination: Secondary | ICD-10-CM | POA: Insufficient documentation

## 2023-03-08 DIAGNOSIS — Z01818 Encounter for other preprocedural examination: Secondary | ICD-10-CM | POA: Diagnosis present

## 2023-03-08 DIAGNOSIS — M1611 Unilateral primary osteoarthritis, right hip: Secondary | ICD-10-CM | POA: Insufficient documentation

## 2023-03-08 LAB — BASIC METABOLIC PANEL
Anion gap: 10 (ref 5–15)
BUN: 20 mg/dL (ref 8–23)
CO2: 25 mmol/L (ref 22–32)
Calcium: 9.5 mg/dL (ref 8.9–10.3)
Chloride: 102 mmol/L (ref 98–111)
Creatinine, Ser: 0.87 mg/dL (ref 0.44–1.00)
GFR, Estimated: >60 mL/min (ref >60)
Glucose, Bld: 111 mg/dL — ABNORMAL HIGH (ref 70–99)
Potassium: 3.7 mmol/L (ref 3.5–5.1)
Sodium: 137 mmol/L (ref 135–145)

## 2023-03-08 LAB — CBC
HCT: 34.1 % — ABNORMAL LOW (ref 36.0–46.0)
Hemoglobin: 10.7 g/dL — ABNORMAL LOW (ref 12.0–15.0)
MCH: 26.6 pg (ref 26.0–34.0)
MCHC: 31.4 g/dL (ref 30.0–36.0)
MCV: 84.6 fL (ref 80.0–100.0)
Platelets: 276 10*3/uL (ref 150–400)
RBC: 4.03 MIL/uL (ref 3.87–5.11)
RDW: 14.7 % (ref 11.5–15.5)
WBC: 5.3 10*3/uL (ref 4.0–10.5)
nRBC: 0 % (ref 0.0–0.2)

## 2023-03-08 LAB — SURGICAL PCR SCREEN
MRSA, PCR: NEGATIVE
Staphylococcus aureus: NEGATIVE

## 2023-03-08 LAB — TYPE AND SCREEN
ABO/RH(D): O POS
Antibody Screen: NEGATIVE

## 2023-03-08 NOTE — Progress Notes (Signed)
PCP - Evangeline Dakin, PA Cardiologist - Dr. Riley Lam - Last office visit 06/20/2022 - MD said pt can be seen PRN, but pt prefers yearly visits due to family hx of cardiac dx.  PPM/ICD - Denies Device Orders - n/a Rep Notified - n/a  Chest x-ray - Denies EKG - 06/20/2022 Stress Test - Denies ECHO - Denies Cardiac Cath - Denies  Sleep Study - Denies CPAP - n/a  Pt is Pre-DM  Last dose of GLP1 agonist- n/a GLP1 instructions: n/a  Blood Thinner Instructions: n/a Aspirin Instructions: n/a  ERAS Protcol - Clear liquids until 0415 morning of surgery PRE-SURGERY Ensure or G2- Ensure given to pt with instructions  COVID TEST- n/a   Anesthesia review: No.   Patient denies shortness of breath, fever, cough and chest pain at PAT appointment. Pt denies any respiratory illness/infection in the last two months.    All instructions explained to the patient, with a verbal understanding of the material. Patient agrees to go over the instructions while at home for a better understanding. Patient also instructed to self quarantine after being tested for COVID-19. The opportunity to ask questions was provided.

## 2023-03-13 ENCOUNTER — Other Ambulatory Visit: Payer: Self-pay | Admitting: Physician Assistant

## 2023-03-13 MED ORDER — ASPIRIN 81 MG PO TBEC: 81.0000 mg | DELAYED_RELEASE_TABLET | Freq: Two times a day (BID) | ORAL | 0 refills | Status: DC

## 2023-03-13 MED ORDER — ONDANSETRON HCL 4 MG PO TABS: 4.0000 mg | ORAL_TABLET | Freq: Three times a day (TID) | ORAL | 0 refills | Status: DC | PRN

## 2023-03-13 MED ORDER — METHOCARBAMOL 750 MG PO TABS: 750.0000 mg | ORAL_TABLET | Freq: Two times a day (BID) | ORAL | 2 refills | Status: DC | PRN

## 2023-03-13 MED ORDER — OXYCODONE-ACETAMINOPHEN 5-325 MG PO TABS: 1.0000 | ORAL_TABLET | Freq: Four times a day (QID) | ORAL | 0 refills | Status: DC | PRN

## 2023-03-13 MED ORDER — DOCUSATE SODIUM 100 MG PO CAPS: 100.0000 mg | ORAL_CAPSULE | Freq: Every day | ORAL | 2 refills | Status: DC | PRN

## 2023-03-16 NOTE — Anesthesia Preprocedure Evaluation (Signed)
Anesthesia Evaluation  Patient identified by MRN, date of birth, ID band Patient awake    Reviewed: Allergy & Precautions, H&P , NPO status , Patient's Chart, lab work & pertinent test results, reviewed documented beta blocker date and time   Airway Mallampati: II  TM Distance: >3 FB Neck ROM: Full    Dental no notable dental hx. (+) Teeth Intact, Dental Advisory Given   Pulmonary neg pulmonary ROS   Pulmonary exam normal breath sounds clear to auscultation       Cardiovascular Exercise Tolerance: Good hypertension, Pt. on medications and Pt. on home beta blockers  Rhythm:Regular Rate:Normal     Neuro/Psych negative neurological ROS  negative psych ROS   GI/Hepatic negative GI ROS, Neg liver ROS,,,  Endo/Other  negative endocrine ROS    Renal/GU negative Renal ROS  negative genitourinary   Musculoskeletal  (+) Arthritis , Osteoarthritis,    Abdominal   Peds  Hematology  (+) Blood dyscrasia, anemia   Anesthesia Other Findings   Reproductive/Obstetrics negative OB ROS                             Anesthesia Physical Anesthesia Plan  ASA: 2  Anesthesia Plan: Spinal   Post-op Pain Management: Tylenol PO (pre-op)*   Induction: Intravenous  PONV Risk Score and Plan: 3 and Ondansetron, Dexamethasone, Propofol infusion and Midazolam  Airway Management Planned: Natural Airway and Simple Face Mask  Additional Equipment:   Intra-op Plan:   Post-operative Plan:   Informed Consent: I have reviewed the patients History and Physical, chart, labs and discussed the procedure including the risks, benefits and alternatives for the proposed anesthesia with the patient or authorized representative who has indicated his/her understanding and acceptance.     Dental advisory given  Plan Discussed with: CRNA  Anesthesia Plan Comments:        Anesthesia Quick Evaluation

## 2023-03-18 ENCOUNTER — Ambulatory Visit (HOSPITAL_COMMUNITY): Payer: Medicare Other | Admitting: Anesthesiology

## 2023-03-18 ENCOUNTER — Observation Stay (HOSPITAL_COMMUNITY): Payer: Medicare Other

## 2023-03-18 ENCOUNTER — Observation Stay (HOSPITAL_COMMUNITY)
Admission: RE | Admit: 2023-03-18 | Discharge: 2023-03-19 | Disposition: A | Discharge status: Home / Self Care | Payer: Medicare Other | Attending: Orthopaedic Surgery | Admitting: Orthopaedic Surgery

## 2023-03-18 ENCOUNTER — Encounter (HOSPITAL_COMMUNITY)
Admission: RE | Disposition: A | Discharge status: Home / Self Care | Payer: Self-pay | Source: Home / Self Care | Attending: Orthopaedic Surgery

## 2023-03-18 ENCOUNTER — Other Ambulatory Visit: Payer: Self-pay

## 2023-03-18 ENCOUNTER — Ambulatory Visit (HOSPITAL_COMMUNITY): Payer: Medicare Other

## 2023-03-18 DIAGNOSIS — I10 Essential (primary) hypertension: Secondary | ICD-10-CM | POA: Insufficient documentation

## 2023-03-18 DIAGNOSIS — M1611 Unilateral primary osteoarthritis, right hip: Principal | ICD-10-CM

## 2023-03-18 DIAGNOSIS — Z79899 Other long term (current) drug therapy: Secondary | ICD-10-CM | POA: Insufficient documentation

## 2023-03-18 DIAGNOSIS — Z7982 Long term (current) use of aspirin: Secondary | ICD-10-CM | POA: Diagnosis not present

## 2023-03-18 DIAGNOSIS — Z96641 Presence of right artificial hip joint: Secondary | ICD-10-CM

## 2023-03-18 HISTORY — PX: TOTAL HIP ARTHROPLASTY: SHX124

## 2023-03-18 SURGERY — ARTHROPLASTY, HIP, TOTAL, ANTERIOR APPROACH
Anesthesia: General | Site: Hip | Laterality: Right

## 2023-03-18 MED ORDER — MIDAZOLAM HCL 2 MG/2ML IJ SOLN
INTRAMUSCULAR | Status: DC | PRN
  Administered 2023-03-18: 2 mg via INTRAVENOUS

## 2023-03-18 MED ORDER — LACTATED RINGERS IV SOLN: INTRAVENOUS | Status: DC

## 2023-03-18 MED ORDER — PRONTOSAN WOUND IRRIGATION OPTIME
TOPICAL | Status: DC | PRN
  Administered 2023-03-18: 1 via TOPICAL

## 2023-03-18 MED ORDER — 0.9 % SODIUM CHLORIDE (POUR BTL) OPTIME
TOPICAL | Status: DC | PRN
  Administered 2023-03-18: 1000 mL

## 2023-03-18 MED ORDER — MAGNESIUM CITRATE PO SOLN: 1.0000 | Freq: Once | ORAL | Status: DC | PRN

## 2023-03-18 MED ORDER — PHENOL 1.4 % MT LIQD: 1.0000 | OROMUCOSAL | Status: DC | PRN

## 2023-03-18 MED ORDER — TRANEXAMIC ACID-NACL 1000-0.7 MG/100ML-% IV SOLN
1000.0000 mg | INTRAVENOUS | Status: AC
  Administered 2023-03-18: 1000 mg via INTRAVENOUS
  Filled 2023-03-18: qty 100

## 2023-03-18 MED ORDER — ONDANSETRON HCL 4 MG/2ML IJ SOLN
4.0000 mg | Freq: Four times a day (QID) | INTRAMUSCULAR | Status: DC | PRN
  Administered 2023-03-18: 4 mg via INTRAVENOUS
  Filled 2023-03-18: qty 2

## 2023-03-18 MED ORDER — TRANEXAMIC ACID-NACL 1000-0.7 MG/100ML-% IV SOLN
INTRAVENOUS | Status: AC
  Filled 2023-03-18: qty 100

## 2023-03-18 MED ORDER — TRANEXAMIC ACID 1000 MG/10ML IV SOLN
2000.0000 mg | INTRAVENOUS | Status: DC
  Filled 2023-03-18: qty 20

## 2023-03-18 MED ORDER — ACETAMINOPHEN 325 MG PO TABS: 325.0000 mg | ORAL_TABLET | Freq: Four times a day (QID) | ORAL | Status: DC | PRN

## 2023-03-18 MED ORDER — BUPIVACAINE-MELOXICAM ER 400-12 MG/14ML IJ SOLN
INTRAMUSCULAR | Status: DC | PRN
  Administered 2023-03-18: 400 mg

## 2023-03-18 MED ORDER — SODIUM CHLORIDE 0.9 % IV SOLN: INTRAVENOUS | Status: DC

## 2023-03-18 MED ORDER — VANCOMYCIN HCL 1000 MG IV SOLR
INTRAVENOUS | Status: AC
  Filled 2023-03-18: qty 20

## 2023-03-18 MED ORDER — ORAL CARE MOUTH RINSE: 15.0000 mL | Freq: Once | OROMUCOSAL | Status: AC

## 2023-03-18 MED ORDER — PANTOPRAZOLE SODIUM 40 MG PO TBEC
40.0000 mg | DELAYED_RELEASE_TABLET | Freq: Every day | ORAL | Status: DC
  Administered 2023-03-18 – 2023-03-19 (×2): 40 mg via ORAL
  Filled 2023-03-18 (×2): qty 1

## 2023-03-18 MED ORDER — OXYCODONE HCL 5 MG PO TABS
5.0000 mg | ORAL_TABLET | ORAL | Status: DC | PRN
  Administered 2023-03-18 – 2023-03-19 (×3): 5 mg via ORAL
  Filled 2023-03-18 (×3): qty 1
  Filled 2023-03-18: qty 2

## 2023-03-18 MED ORDER — AMOXICILLIN-POT CLAVULANATE 875-125 MG PO TABS
1.0000 | ORAL_TABLET | Freq: Two times a day (BID) | ORAL | Status: DC
  Administered 2023-03-19: 1 via ORAL
  Filled 2023-03-18 (×2): qty 1

## 2023-03-18 MED ORDER — FENTANYL CITRATE (PF) 100 MCG/2ML IJ SOLN
25.0000 ug | INTRAMUSCULAR | Status: DC | PRN
  Administered 2023-03-18: 50 ug via INTRAVENOUS

## 2023-03-18 MED ORDER — HYDROMORPHONE HCL 1 MG/ML IJ SOLN: 0.5000 mg | INTRAMUSCULAR | Status: DC | PRN

## 2023-03-18 MED ORDER — DOCUSATE SODIUM 100 MG PO CAPS
100.0000 mg | ORAL_CAPSULE | Freq: Two times a day (BID) | ORAL | Status: DC
  Administered 2023-03-18 – 2023-03-19 (×3): 100 mg via ORAL
  Filled 2023-03-18 (×3): qty 1

## 2023-03-18 MED ORDER — OXYCODONE HCL 5 MG PO TABS
10.0000 mg | ORAL_TABLET | ORAL | Status: DC | PRN
  Administered 2023-03-19: 10 mg via ORAL
  Filled 2023-03-18: qty 2

## 2023-03-18 MED ORDER — METHOCARBAMOL 1000 MG/10ML IJ SOLN: 500.0000 mg | Freq: Four times a day (QID) | INTRAVENOUS | Status: DC | PRN

## 2023-03-18 MED ORDER — SORBITOL 70 % SOLN: 30.0000 mL | Freq: Every day | Status: DC | PRN

## 2023-03-18 MED ORDER — ASPIRIN 81 MG PO CHEW
81.0000 mg | CHEWABLE_TABLET | Freq: Two times a day (BID) | ORAL | Status: DC
  Administered 2023-03-18 – 2023-03-19 (×2): 81 mg via ORAL
  Filled 2023-03-18 (×3): qty 1

## 2023-03-18 MED ORDER — TRANEXAMIC ACID-NACL 1000-0.7 MG/100ML-% IV SOLN
1000.0000 mg | Freq: Once | INTRAVENOUS | Status: AC
  Administered 2023-03-18: 1000 mg via INTRAVENOUS
  Filled 2023-03-18: qty 100

## 2023-03-18 MED ORDER — MIDAZOLAM HCL 2 MG/2ML IJ SOLN
INTRAMUSCULAR | Status: AC
  Filled 2023-03-18: qty 2

## 2023-03-18 MED ORDER — MENTHOL 3 MG MT LOZG: 1.0000 | LOZENGE | OROMUCOSAL | Status: DC | PRN

## 2023-03-18 MED ORDER — PROPOFOL 500 MG/50ML IV EMUL
INTRAVENOUS | Status: DC | PRN
  Administered 2023-03-18: 100 ug/kg/min via INTRAVENOUS

## 2023-03-18 MED ORDER — DIPHENHYDRAMINE HCL 12.5 MG/5ML PO ELIX: 25.0000 mg | ORAL_SOLUTION | ORAL | Status: DC | PRN

## 2023-03-18 MED ORDER — ONDANSETRON HCL 4 MG PO TABS: 4.0000 mg | ORAL_TABLET | Freq: Four times a day (QID) | ORAL | Status: DC | PRN

## 2023-03-18 MED ORDER — DEXAMETHASONE SODIUM PHOSPHATE 10 MG/ML IJ SOLN
10.0000 mg | Freq: Once | INTRAMUSCULAR | Status: AC
  Administered 2023-03-19: 10 mg via INTRAVENOUS
  Filled 2023-03-18: qty 1

## 2023-03-18 MED ORDER — CHLORHEXIDINE GLUCONATE 0.12 % MT SOLN
15.0000 mL | Freq: Once | OROMUCOSAL | Status: AC
  Administered 2023-03-18: 15 mL via OROMUCOSAL
  Filled 2023-03-18: qty 15

## 2023-03-18 MED ORDER — ACETAMINOPHEN 500 MG PO TABS
1000.0000 mg | ORAL_TABLET | Freq: Four times a day (QID) | ORAL | Status: AC
  Administered 2023-03-18 – 2023-03-19 (×3): 1000 mg via ORAL
  Filled 2023-03-18 (×3): qty 2

## 2023-03-18 MED ORDER — FERROUS SULFATE 325 (65 FE) MG PO TABS
325.0000 mg | ORAL_TABLET | Freq: Three times a day (TID) | ORAL | Status: DC
  Administered 2023-03-18 – 2023-03-19 (×3): 325 mg via ORAL
  Filled 2023-03-18 (×3): qty 1

## 2023-03-18 MED ORDER — HYDROXYZINE HCL 50 MG/ML IM SOLN
50.0000 mg | Freq: Four times a day (QID) | INTRAMUSCULAR | Status: DC | PRN
  Administered 2023-03-18: 50 mg via INTRAMUSCULAR
  Filled 2023-03-18: qty 1

## 2023-03-18 MED ORDER — POLYETHYLENE GLYCOL 3350 17 G PO PACK
17.0000 g | PACK | Freq: Every day | ORAL | Status: DC
  Administered 2023-03-18 – 2023-03-19 (×2): 17 g via ORAL
  Filled 2023-03-18 (×2): qty 1

## 2023-03-18 MED ORDER — DEXAMETHASONE SODIUM PHOSPHATE 10 MG/ML IJ SOLN
INTRAMUSCULAR | Status: DC | PRN
  Administered 2023-03-18: 10 mg via INTRAVENOUS

## 2023-03-18 MED ORDER — VANCOMYCIN HCL 1 G IV SOLR
INTRAVENOUS | Status: DC | PRN
  Administered 2023-03-18: 1000 mg via TOPICAL

## 2023-03-18 MED ORDER — METHOCARBAMOL 500 MG PO TABS
500.0000 mg | ORAL_TABLET | Freq: Four times a day (QID) | ORAL | Status: DC | PRN
  Administered 2023-03-18 – 2023-03-19 (×3): 500 mg via ORAL
  Filled 2023-03-18 (×3): qty 1

## 2023-03-18 MED ORDER — CEFAZOLIN SODIUM-DEXTROSE 2-4 GM/100ML-% IV SOLN
2.0000 g | INTRAVENOUS | Status: AC
  Administered 2023-03-18: 2 g via INTRAVENOUS
  Filled 2023-03-18: qty 100

## 2023-03-18 MED ORDER — METOCLOPRAMIDE HCL 5 MG/ML IJ SOLN: 5.0000 mg | Freq: Three times a day (TID) | INTRAMUSCULAR | Status: DC | PRN

## 2023-03-18 MED ORDER — PROPOFOL 10 MG/ML IV BOLUS
INTRAVENOUS | Status: AC
  Filled 2023-03-18: qty 20

## 2023-03-18 MED ORDER — ONDANSETRON HCL 4 MG/2ML IJ SOLN
INTRAMUSCULAR | Status: DC | PRN
  Administered 2023-03-18: 4 mg via INTRAVENOUS

## 2023-03-18 MED ORDER — ACETAMINOPHEN 500 MG PO TABS
1000.0000 mg | ORAL_TABLET | Freq: Once | ORAL | Status: AC
  Administered 2023-03-18: 1000 mg via ORAL
  Filled 2023-03-18: qty 2

## 2023-03-18 MED ORDER — POVIDONE-IODINE 10 % EX SWAB
2.0000 | Freq: Once | CUTANEOUS | Status: AC
  Administered 2023-03-18: 2 via TOPICAL

## 2023-03-18 MED ORDER — AMOXICILLIN-POT CLAVULANATE 875-125 MG PO TABS: 1.0000 | ORAL_TABLET | Freq: Two times a day (BID) | ORAL | 0 refills | Status: AC

## 2023-03-18 MED ORDER — FENTANYL CITRATE (PF) 100 MCG/2ML IJ SOLN
INTRAMUSCULAR | Status: AC
  Filled 2023-03-18: qty 2

## 2023-03-18 MED ORDER — ALUM & MAG HYDROXIDE-SIMETH 200-200-20 MG/5ML PO SUSP: 30.0000 mL | ORAL | Status: DC | PRN

## 2023-03-18 MED ORDER — CEFAZOLIN SODIUM-DEXTROSE 2-4 GM/100ML-% IV SOLN
2.0000 g | Freq: Four times a day (QID) | INTRAVENOUS | Status: AC
  Administered 2023-03-18 (×2): 2 g via INTRAVENOUS
  Filled 2023-03-18 (×2): qty 100

## 2023-03-18 MED ORDER — TRANEXAMIC ACID 1000 MG/10ML IV SOLN
INTRAVENOUS | Status: DC | PRN
  Administered 2023-03-18: 2000 mg via TOPICAL

## 2023-03-18 MED ORDER — BUPIVACAINE-MELOXICAM ER 400-12 MG/14ML IJ SOLN
INTRAMUSCULAR | Status: AC
  Filled 2023-03-18: qty 1

## 2023-03-18 MED ORDER — FENTANYL CITRATE (PF) 250 MCG/5ML IJ SOLN
INTRAMUSCULAR | Status: DC | PRN
  Administered 2023-03-18: 50 ug via INTRAVENOUS
  Administered 2023-03-18: 100 ug via INTRAVENOUS
  Administered 2023-03-18: 50 ug via INTRAVENOUS

## 2023-03-18 MED ORDER — FENTANYL CITRATE (PF) 250 MCG/5ML IJ SOLN
INTRAMUSCULAR | Status: AC
  Filled 2023-03-18: qty 5

## 2023-03-18 MED ORDER — SODIUM CHLORIDE 0.9 % IR SOLN
Status: DC | PRN
  Administered 2023-03-18: 1000 mL

## 2023-03-18 MED ORDER — METOCLOPRAMIDE HCL 5 MG PO TABS: 5.0000 mg | ORAL_TABLET | Freq: Three times a day (TID) | ORAL | Status: DC | PRN

## 2023-03-18 SURGICAL SUPPLY — 65 items
ADH SKN CLS APL DERMABOND .7 (GAUZE/BANDAGES/DRESSINGS) ×1
BAG COUNTER SPONGE SURGICOUNT (BAG) ×1 IMPLANT
BAG DECANTER FOR FLEXI CONT (MISCELLANEOUS) ×1 IMPLANT
BAG SPNG CNTER NS LX DISP (BAG) ×1
BLADE SAG 18X100X1.27 (BLADE) ×1 IMPLANT
COVER PERINEAL POST (MISCELLANEOUS) ×1 IMPLANT
COVER SURGICAL LIGHT HANDLE (MISCELLANEOUS) ×1 IMPLANT
CUP SECTOR GRIPTON 50MM (Cup) IMPLANT
DERMABOND ADVANCED .7 DNX12 (GAUZE/BANDAGES/DRESSINGS) IMPLANT
DRAPE C-ARM 42X72 X-RAY (DRAPES) ×1 IMPLANT
DRAPE POUCH INSTRU U-SHP 10X18 (DRAPES) ×1 IMPLANT
DRAPE STERI IOBAN 125X83 (DRAPES) ×1 IMPLANT
DRAPE U-SHAPE 47X51 STRL (DRAPES) ×2 IMPLANT
DRSG AQUACEL AG ADV 3.5X10 (GAUZE/BANDAGES/DRESSINGS) ×1 IMPLANT
DURAPREP 26ML APPLICATOR (WOUND CARE) ×2 IMPLANT
ELECT BLADE 4.0 EZ CLEAN MEGAD (MISCELLANEOUS) ×1
ELECT REM PT RETURN 9FT ADLT (ELECTROSURGICAL) ×1
ELECTRODE BLDE 4.0 EZ CLN MEGD (MISCELLANEOUS) ×1 IMPLANT
ELECTRODE REM PT RTRN 9FT ADLT (ELECTROSURGICAL) ×1 IMPLANT
GLOVE BIOGEL PI IND STRL 7.0 (GLOVE) ×2 IMPLANT
GLOVE BIOGEL PI IND STRL 7.5 (GLOVE) ×5 IMPLANT
GLOVE ECLIPSE 7.0 STRL STRAW (GLOVE) ×2 IMPLANT
GLOVE SKINSENSE STRL SZ7.5 (GLOVE) ×1 IMPLANT
GLOVE SURG SYN 7.5 E (GLOVE) ×2 IMPLANT
GLOVE SURG SYN 7.5 PF PI (GLOVE) ×2 IMPLANT
GLOVE SURG UNDER POLY LF SZ7 (GLOVE) ×3 IMPLANT
GLOVE SURG UNDER POLY LF SZ7.5 (GLOVE) ×2 IMPLANT
GOWN STRL REUS W/ TWL LRG LVL3 (GOWN DISPOSABLE) IMPLANT
GOWN STRL REUS W/ TWL XL LVL3 (GOWN DISPOSABLE) ×1 IMPLANT
GOWN STRL REUS W/TWL LRG LVL3 (GOWN DISPOSABLE)
GOWN STRL REUS W/TWL XL LVL3 (GOWN DISPOSABLE) ×1
GOWN STRL SURGICAL XL XLNG (GOWN DISPOSABLE) ×1 IMPLANT
GOWN TOGA ZIPPER T7+ PEEL AWAY (MISCELLANEOUS) ×2 IMPLANT
HANDPIECE INTERPULSE COAX TIP (DISPOSABLE) ×1
HEAD FEMORAL 32 CERAMIC (Hips) IMPLANT
HOOD PEEL AWAY T7 (MISCELLANEOUS) ×1 IMPLANT
IV NS IRRIG 3000ML ARTHROMATIC (IV SOLUTION) ×1 IMPLANT
KIT BASIN OR (CUSTOM PROCEDURE TRAY) ×1 IMPLANT
LINER ACET PNNCL PLUS4 NEUTRAL (Hips) IMPLANT
MARKER SKIN DUAL TIP RULER LAB (MISCELLANEOUS) ×1 IMPLANT
NDL SPNL 18GX3.5 QUINCKE PK (NEEDLE) ×1 IMPLANT
NEEDLE SPNL 18GX3.5 QUINCKE PK (NEEDLE) ×1 IMPLANT
PACK TOTAL JOINT (CUSTOM PROCEDURE TRAY) ×1 IMPLANT
PACK UNIVERSAL I (CUSTOM PROCEDURE TRAY) ×1 IMPLANT
PINNACLE PLUS 4 NEUTRAL (Hips) ×1 IMPLANT
SET HNDPC FAN SPRY TIP SCT (DISPOSABLE) ×1 IMPLANT
SOLUTION PRONTOSAN WOUND 350ML (IRRIGATION / IRRIGATOR) ×1 IMPLANT
STAPLER VISISTAT 35W (STAPLE) IMPLANT
STEM FEM ACTIS HIGH SZ2 (Stem) IMPLANT
SUT ETHIBOND 2 V 37 (SUTURE) ×1 IMPLANT
SUT ETHILON 2 0 FS 18 (SUTURE) IMPLANT
SUT VIC AB 0 CT1 27 (SUTURE) ×1
SUT VIC AB 0 CT1 27XBRD ANBCTR (SUTURE) ×1 IMPLANT
SUT VIC AB 1 CTX 36 (SUTURE) ×1
SUT VIC AB 1 CTX36XBRD ANBCTR (SUTURE) ×1 IMPLANT
SUT VIC AB 2-0 CT1 (SUTURE) IMPLANT
SUT VIC AB 2-0 CT1 27 (SUTURE) ×2
SUT VIC AB 2-0 CT1 TAPERPNT 27 (SUTURE) ×2 IMPLANT
SYR 50ML LL SCALE MARK (SYRINGE) ×1 IMPLANT
TOWEL GREEN STERILE (TOWEL DISPOSABLE) ×1 IMPLANT
TRAY CATH INTERMITTENT SS 16FR (CATHETERS) IMPLANT
TRAY FOLEY W/BAG SLVR 16FR (SET/KITS/TRAYS/PACK)
TRAY FOLEY W/BAG SLVR 16FR ST (SET/KITS/TRAYS/PACK) IMPLANT
TUBE SUCT ARGYLE STRL (TUBING) ×1 IMPLANT
YANKAUER SUCT BULB TIP NO VENT (SUCTIONS) ×1 IMPLANT

## 2023-03-18 NOTE — Discharge Instructions (Signed)

## 2023-03-18 NOTE — Progress Notes (Signed)
Patient states she was bitten by her cat yesterday.  The index finger is slightly swollen.  No signs of infection but given history, will place her on 10 days of augmentin. This has been sent to the pharmacy.

## 2023-03-18 NOTE — H&P (Signed)
PREOPERATIVE H&P  Chief Complaint: right hip osteoarthritis  HPI: Amanda Mccullough is a 67 y.o. female who presents for surgical treatment of right hip osteoarthritis.  She denies any changes in medical history.  Past Surgical History:  Procedure Laterality Date   BIOPSY BREAST Right 2011   benign   COLONOSCOPY     DIAGNOSTIC LAPAROSCOPY  1994   endometriosis and scar tissue removal   MYOMECTOMY ABDOMINAL APPROACH     x 1 -  1991, 2001   TONSILLECTOMY     As a teenager   WISDOM TOOTH EXTRACTION     Social History   Socioeconomic History   Marital status: Single    Spouse name: Not on file   Number of children: Not on file   Years of education: Not on file   Highest education level: Not on file  Occupational History   Not on file  Tobacco Use   Smoking status: Never   Smokeless tobacco: Never  Vaping Use   Vaping status: Never Used  Substance and Sexual Activity   Alcohol use: Yes    Comment: occasional wine   Drug use: Not Currently   Sexual activity: Not Currently    Birth control/protection: None  Other Topics Concern   Not on file  Social History Narrative   Not on file   Social Determinants of Health   Financial Resource Strain: Not on file  Food Insecurity: Not on file  Transportation Needs: Not on file  Physical Activity: Not on file  Stress: Not on file (07/31/2019)  Social Connections: Not on file   Family History  Problem Relation Age of Onset   Hypertension Mother    Thyroid disease Mother    Glaucoma Mother    Hypertension Father    Cancer - Prostate Father    Hypertension Sister    Heart disease Sister    Thyroid disease Sister    Glaucoma Sister    Hypertension Sister    Hypertension Sister    Allergies  Allergen Reactions   Codeine Anaphylaxis and Hives   Prior to Admission medications   Medication Sig Start Date End Date Taking? Authorizing Provider  diclofenac (VOLTAREN) 75 MG EC tablet Take 1 tablet (75 mg total) by mouth 2  (two) times daily as needed. Patient taking differently: Take 75 mg by mouth 2 (two) times daily. 11/20/22  Yes Tarry Kos, MD  diphenhydrAMINE (BENADRYL) 50 MG capsule Take 50 mg by mouth at bedtime as needed (sleep/allergies.).   Yes [provider]  Ferrous Sulfate Dried (FERROUS SULFATE CR PO) Take 65 mg by mouth in the morning.   Yes [provider]  fluticasone (FLONASE) 50 MCG/ACT nasal spray Place 1-2 sprays into both nostrils daily as needed for allergies.   Yes [provider]  hydrochlorothiazide (MICROZIDE) 12.5 MG capsule Take 1 capsule (12.5 mg total) by mouth daily. 06/20/22  Yes Chandrasekhar, Mahesh A, MD  Multiple Vitamins-Minerals (CENTRUM SILVER 50+WOMEN PO) Take 1 tablet by mouth in the morning.   Yes [provider]  OVER THE COUNTER MEDICATION Apply 1 application  topically 2 (two) times daily as needed (joint pain). Hempvana Pain Relief Cream   Yes [provider]  propranolol (INDERAL) 20 MG tablet Take 20 mg by mouth in the morning. 06/14/20  Yes [provider]  Pseudoephedrine-Ibuprofen (ADVIL COLD & SINUS LIQUI-GELS PO) Take 1 tablet by mouth daily as needed (sinus/allergy symptoms).   Yes [provider]  rosuvastatin (CRESTOR)  5 MG tablet Take 1 tablet (5 mg total) by mouth daily. 07/09/22 07/04/23 Yes Chandrasekhar, Mahesh A, MD  simethicone (MYLICON) 80 MG chewable tablet Chew 80-160 mg by mouth every 6 (six) hours as needed for flatulence.   Yes [provider]  aspirin EC 81 MG tablet Take 1 tablet (81 mg total) by mouth 2 (two) times daily. To be taken after surgery to prevent blood clots 03/13/23 03/12/24  Cristie Hem, PA-C  docusate sodium (COLACE) 100 MG capsule Take 1 capsule (100 mg total) by mouth daily as needed. 03/13/23 03/12/24  Cristie Hem, PA-C  methocarbamol (ROBAXIN-750) 750 MG tablet Take 1 tablet (750 mg total) by mouth 2 (two) times daily as needed for muscle spasms. 03/13/23    Cristie Hem, PA-C  ondansetron (ZOFRAN) 4 MG tablet Take 1 tablet (4 mg total) by mouth every 8 (eight) hours as needed for nausea or vomiting. 03/13/23   Cristie Hem, PA-C  oxyCODONE-acetaminophen (PERCOCET/ROXICET) 5-325 MG tablet Take 1-2 tablets by mouth every 6 (six) hours as needed for severe pain. To be taken after surgery 03/13/23   Cristie Hem, PA-C     Positive ROS: All other systems have been reviewed and were otherwise negative with the exception of those mentioned in the HPI and as above.  Physical Exam: General: Alert, no acute distress Cardiovascular: No pedal edema Respiratory: No cyanosis, no use of accessory musculature GI: abdomen soft Skin: No lesions in the area of chief complaint Neurologic: Sensation intact distally Psychiatric: Patient is competent for consent with normal mood and affect Lymphatic: no lymphedema  MUSCULOSKELETAL: exam stable  Assessment: right hip osteoarthritis  Plan: Plan for Procedure(s): RIGHT TOTAL HIP ARTHROPLASTY ANTERIOR APPROACH  The risks benefits and alternatives were discussed with the patient including but not limited to the risks of nonoperative treatment, versus surgical intervention including infection, bleeding, nerve injury,  blood clots, cardiopulmonary complications, morbidity, mortality, among others, and they were willing to proceed.   Glee Arvin, MD 03/18/2023 6:15 AM

## 2023-03-18 NOTE — Op Note (Signed)
RIGHT TOTAL HIP ARTHROPLASTY ANTERIOR APPROACH  Procedure Note Amanda Mccullough   [id number]  Pre-op Diagnosis: right hip osteoarthritis     Post-op Diagnosis: same  Operative Findings Complete loss of joint space and articular cartilage   Operative Procedures  1. Total hip replacement; Right hip; uncemented [id number]   Surgeon: Gershon Mussel, M.D.  Assist: Oneal Grout, PA-C   Anesthesia: spinal, LMA general  Prosthesis: Depuy Acetabulum: Pinnacle 50 mm Femur: Actis 2 HO Head: 32 mm size: +1 Liner: +4 Bearing Type: ceramic/poly  Total Hip Arthroplasty (Anterior Approach) Op Note:  After informed consent was obtained and the operative extremity marked in the holding area, the patient was brought back to the operating room and placed supine on the HANA table. Next, the operative extremity was prepped and draped in normal sterile fashion. Surgical timeout occurred verifying patient identification, surgical site, surgical procedure and administration of antibiotics.  A 10 cm longitudinal incision was made starting from 2 fingerbreadths lateral and inferior to the ASIS towards the lateral aspect of the patella.  A Hueter approach to the hip was performed, using the interval between tensor fascia lata and sartorius.  Dissection was carried bluntly down onto the anterior hip capsule. The lateral femoral circumflex vessels were identified and coagulated. A capsulotomy was performed and the capsular flaps tagged for later repair.  The neck osteotomy was performed. The femoral head was removed which showed severe wear, the acetabular rim was cleared of soft tissue and osteophytes and attention was turned to reaming the acetabulum.  Sequential reaming was performed under fluoroscopic guidance down to the floor of the cotyloid fossa. We reamed to a size 49 mm, and then impacted the acetabular shell.  The liner was then placed after irrigation and attention turned to the femur.  After placing  the femoral hook, the leg was taken to externally rotated, extended and adducted position taking care to perform soft tissue releases to allow for adequate mobilization of the femur. Soft tissue was cleared from the shoulder of the greater trochanter and the hook elevator used to improve exposure of the proximal femur. Sequential broaching performed up to a size 2.  Calcar planar was used to smooth the neck cut.  Trial neck and head were placed. The leg was brought back up to neutral and the construct reduced.  Antibiotic irrigation was placed in the surgical wound.  The position and sizing of components, offset and leg lengths were checked using fluoroscopy. Stability of the construct was checked in extension and external rotation without any subluxation, shuck or impingement of prosthesis. We dislocated the prosthesis, dropped the leg back into position, removed trial components, and irrigated copiously. The final stem and head was then placed, the leg brought back up, the system reduced and fluoroscopy used to verify positioning.  We irrigated, obtained hemostasis and closed the capsule using #2 ethibond suture.  One gram of vancomycin powder was placed in the surgical bed.   One gram of topical tranexamic acid was injected into the joint.  The fascia was closed with #1 vicryl plus, the deep fat layer was closed with 0 vicryl, the subcutaneous layers closed with 2.0 Vicryl Plus and the skin closed with 2.0 nylon and dermabond. A sterile dressing was applied. The patient was awakened in the operating room and taken to recovery in stable condition.  All sponge, needle, and instrument counts were correct at the end of the case.   Tessa Lerner, my PA, was a medical necessity for  opening, closing, limb positioning, retracting, exposing, and overall facilitation and timely completion of the surgery.  Position: supine  Complications: see description of procedure.  Time Out: performed   Drains/Packing:  none  Estimated blood loss: see anesthesia record  Returned to Recovery Room: in good condition.   Antibiotics: yes   Mechanical VTE (DVT) Prophylaxis: sequential compression devices, TED thigh-high  Chemical VTE (DVT) Prophylaxis: aspirin   Fluid Replacement: see anesthesia record  Specimens Removed: 1 to pathology   Sponge and Instrument Count Correct? yes   PACU: portable radiograph - low AP   Plan/RTC: Return in 2 weeks for staple removal. Weight Bearing/Load Lower Extremity: full  Hip precautions: none Suture Removal: 2 weeks   N. Glee Arvin, MD [facility] 8:45 AM   Implant Name Type Inv. Item Serial No. Manufacturer Lot No. LRB No. Used Action  CUP SECTOR GRIPTON - [id number] Cup CUP SECTOR GRIPTON  [employer] [id number] Right 1 Implanted  PINNACLE PLUS 4 NEUTRAL - [id number] Hips PINNACLE PLUS 4 NEUTRAL  DEPUY ORTH[employer] [id number] Right 1 Implanted  STEM FEM ACTIS HIGH SZ2 - [id number] Stem STEM FEM ACTIS HIGH SZ2  [employer]PU[employer]TH[employer] [id number] Right 1 Implanted  HEAD FEMORAL 32 CERAMIC - [id number] Hips HEAD FEMORAL 32 CERAMIC  [employer] [id number] Right 1 Implanted

## 2023-03-18 NOTE — Evaluation (Signed)
Physical Therapy Evaluation Patient Details Name: Amanda Mccullough MRN: [id number] DOB: [birth year] Today's Date: 03/18/2023  History of Present Illness  67 y.o. female presents to [facility] hospital on 03/18/2023 for elective R THA. PMH includes HTN, insomnia, HLD, sciatica.  Clinical Impression  Pt presents to PT s/p R THA with deficits in strength, power, gait, balance, endurance. Pt is limited by nausea and pain during evaluation, ambulating for ~10' total. Pt will benefit from frequent mobilization in an effort to improve activity tolerance and to restore independence. PT provides education on surgical hip exercise packet in an effort to improve muscle activation. PT will follow up tomorrow for further gait and mobility training.        If plan is discharge home, recommend the following: A little help with walking and/or transfers;A little help with bathing/dressing/bathroom;Assistance with cooking/housework;Assist for transportation;Help with stairs or ramp for entrance   Can travel by private vehicle        Equipment Recommendations Rolling walker (2 wheels)  Recommendations for Other Services       Functional Status Assessment Patient has had a recent decline in their functional status and demonstrates the ability to make significant improvements in function in a reasonable and predictable amount of time.     Precautions / Restrictions Precautions Precautions: Fall Precaution Comments: direct anterior R THA Restrictions Weight Bearing Restrictions: Yes RLE Weight Bearing: Weight bearing as tolerated      Mobility  Bed Mobility Overal bed mobility: Needs Assistance Bed Mobility: Supine to Sit, Sit to Supine     Supine to sit: Min assist, HOB elevated, Used rails Sit to supine: Min assist        Transfers Overall transfer level: Needs assistance Equipment used: Rolling walker (2 wheels) Transfers: Sit to/from Stand Sit to Stand: Contact guard assist           General  transfer comment: verbal cues for technique and hand placement    Ambulation/Gait Ambulation/Gait assistance: Contact guard assist Gait Distance (Feet): 10 Feet Assistive device: Rolling walker (2 wheels) Gait Pattern/deviations: Step-to pattern, Decreased dorsiflexion - right Gait velocity: reduced Gait velocity interpretation: <1.31 ft/sec, indicative of household ambulator   General Gait Details: pt with slowed step-to gait, R foot dragging on floor due to lack of hip flexion  Stairs            Wheelchair Mobility     Tilt Bed    Modified Rankin (Stroke Patients Only)       Balance Overall balance assessment: Needs assistance Sitting-balance support: No upper extremity supported, Feet supported Sitting balance-Leahy Scale: Good     Standing balance support: Bilateral upper extremity supported, Reliant on assistive device for balance Standing balance-Leahy Scale: Poor                               Pertinent Vitals/Pain Pain Assessment Pain Assessment: 0-10 Pain Score: 4  Pain Location: R hip Pain Descriptors / Indicators: Sore Pain Intervention(s): Monitored during session    Home Living Family/patient expects to be discharged to:: Private residence Living Arrangements: Parent Available Help at Discharge: Family;Available 24 hours/day Type of Home: House Home Access: Stairs to enter Entrance Stairs-Rails: Can reach both Entrance Stairs-Number of Steps: 4   Home Layout: One level Home Equipment: Cane - single point;Shower seat;Toilet riser      Prior Function Prior Level of Function : Independent/Modified Independent  Mobility Comments: ambulatory without DME       Extremity/Trunk Assessment   Upper Extremity Assessment Upper Extremity Assessment: Overall WFL for tasks assessed    Lower Extremity Assessment Lower Extremity Assessment: RLE deficits/detail RLE Deficits / Details: generalized post-op weakness, 4/5  DF/PF, 3-/5 hip abductors/adductors/flexors, 3/5 knee extensors    Cervical / Trunk Assessment Cervical / Trunk Assessment: Normal  Communication   Communication Communication: No apparent difficulties Cueing Techniques: Verbal cues  Cognition Arousal: Alert Behavior During Therapy: WFL for tasks assessed/performed Overall Cognitive Status: Within Functional Limits for tasks assessed                                          General Comments General comments (skin integrity, edema, etc.): VSS on RA, pt with intermittent nausea during eval but no vomiting    Exercises Other Exercises Other Exercises: PT provides education on surgical hip exercise packet   Assessment/Plan    PT Assessment Patient needs continued PT services  PT Problem List Decreased strength;Decreased activity tolerance;Decreased balance;Decreased mobility;Decreased knowledge of use of DME;Pain       PT Treatment Interventions Stair training;DME instruction;Gait training;Therapeutic activities;Functional mobility training;Therapeutic exercise;Balance training;Neuromuscular re-education;Patient/family education    PT Goals (Current goals can be found in the Care Plan section)  Acute Rehab PT Goals Patient Stated Goal: to return to independence PT Goal Formulation: With patient Time For Goal Achievement: 03/22/23 Potential to Achieve Goals: Good    Frequency Min 1X/week     Co-evaluation               AM-PAC PT "6 Clicks" Mobility  Outcome Measure Help needed turning from your back to your side while in a flat bed without using bedrails?: A Little Help needed moving from lying on your back to sitting on the side of a flat bed without using bedrails?: A Little Help needed moving to and from a bed to a chair (including a wheelchair)?: A Little Help needed standing up from a chair using your arms (e.g., wheelchair or bedside chair)?: A Little Help needed to walk in hospital room?: A  Little Help needed climbing 3-5 steps with a railing? : Total 6 Click Score: 16    End of Session Equipment Utilized During Treatment: Gait belt Activity Tolerance: Patient limited by pain;Other (comment) (limited by nausea) Patient left: in bed;with call bell/phone within reach;with family/visitor present Nurse Communication: Mobility status PT Visit Diagnosis: Other abnormalities of gait and mobility (R26.89);Muscle weakness (generalized) (M62.81);Pain Pain - Right/Left: Right Pain - part of body: Hip    Time: 1301-1330 PT Time Calculation (min) (ACUTE ONLY): 29 min   Charges:   PT Evaluation $PT Eval Low Complexity: 1 Low   PT General Charges $$ ACUTE PT VISIT: 1 Visit         Arlyss Gandy, PT, DPT Acute Rehabilitation Office [phone]   Arlyss Gandy 03/18/2023, 1:46 PM

## 2023-03-18 NOTE — Care Management Obs Status (Signed)
MEDICARE OBSERVATION STATUS NOTIFICATION   Patient Details  Name: Amanda Mccullough MRN: [id number] Date of Birth: [birth year]   Medicare Observation Status Notification Given:  Yes    Ronny Bacon, RN 03/18/2023, 3:27 PM

## 2023-03-18 NOTE — Anesthesia Postprocedure Evaluation (Signed)
Anesthesia Post Note  Patient: Amanda Mccullough  Procedure(s) Performed: RIGHT TOTAL HIP ARTHROPLASTY ANTERIOR APPROACH (Right: Hip)     Patient location during evaluation: PACU Anesthesia Type: General Level of consciousness: awake and alert Pain management: pain level controlled Vital Signs Assessment: post-procedure vital signs reviewed and stable Respiratory status: spontaneous breathing, nonlabored ventilation and respiratory function stable Cardiovascular status: blood pressure returned to baseline and stable Postop Assessment: no apparent nausea or vomiting and patient able to bend at knees Anesthetic complications: no  No notable events documented.  Last Vitals:  Vitals:   03/18/23 0930 03/18/23 1006  BP: (!) 142/76 127/74  Pulse: 62 60  Resp: 17 20  Temp: 36.6 C 36.9 C  SpO2: 99% 100%    Last Pain:  Vitals:   03/18/23 1006  TempSrc:   PainSc: 4                  Aideen Fenster,W. EDMOND

## 2023-03-18 NOTE — Transfer of Care (Signed)
Immediate Anesthesia Transfer of Care Note  Patient: Amanda Mccullough  Procedure(s) Performed: RIGHT TOTAL HIP ARTHROPLASTY ANTERIOR APPROACH (Right: Hip)  Patient Location: PACU  Anesthesia Type:General  Level of Consciousness: awake  Airway & Oxygen Therapy: Patient Spontanous Breathing and Patient connected to face mask oxygen  Post-op Assessment: Report given to RN and Post -op Vital signs reviewed and stable  Post vital signs: Reviewed and stable  Last Vitals:  Vitals Value Taken Time  BP 138/83 03/18/23 0908  Temp    Pulse 68 03/18/23 0911  Resp 15 03/18/23 0911  SpO2 97 % 03/18/23 0911  Vitals shown include unfiled device data.  Last Pain:  Vitals:   03/18/23 0623  TempSrc:   PainSc: 0-No pain         Complications: No notable events documented.

## 2023-03-18 NOTE — Anesthesia Procedure Notes (Addendum)
Spinal  Patient location during procedure: OR Start time: 03/18/2023 7:16 AM End time: 03/18/2023 7:19 AM Reason for block: surgical anesthesia Staffing Performed: anesthesiologist  Anesthesiologist: Gaynelle Adu, MD Performed by: Kaylyn Layer, MD Authorized by: Gaynelle Adu, MD   Preanesthetic Checklist Completed: patient identified, IV checked, risks and benefits discussed, surgical consent, monitors and equipment checked, pre-op evaluation and timeout performed Spinal Block Patient position: sitting Prep: DuraPrep Patient monitoring: cardiac monitor, continuous pulse ox and blood pressure Approach: midline Location: L3-4 Injection technique: single-shot Needle Needle type: Pencan  Needle gauge: 24 G Needle length: 9 cm Assessment Sensory level: T8 Events: CSF return Additional Notes Functioning IV was confirmed and monitors were applied. Sterile prep and drape, including hand hygiene and sterile gloves were used. The patient was positioned and the spine was prepped. The skin was anesthetized with lidocaine.  Free flow of clear CSF was obtained prior to injecting local anesthetic into the CSF.  The spinal needle aspirated freely following injection.  The needle was carefully withdrawn.  The patient tolerated the procedure well.

## 2023-03-19 DIAGNOSIS — M1611 Unilateral primary osteoarthritis, right hip: Secondary | ICD-10-CM | POA: Diagnosis not present

## 2023-03-19 NOTE — Plan of Care (Signed)
Pt dong well. Pt given D/C instructions with verbal understanding. Rx's were sent to the pharmacy by MD. Pt's incision has no sign of infection. Pt's IV was removed prior to D/C. Pt received RW from [employer] per MD order. Pt D/C'd home via wheelchair per MD order. Pt is stable @ D/C and has no other needs at this time. Rema Fendt, RN

## 2023-03-19 NOTE — Progress Notes (Signed)
Subjective: 1 Day Post-Op Procedure(s) (LRB): RIGHT TOTAL HIP ARTHROPLASTY ANTERIOR APPROACH (Right) Patient reports pain as mild.    Objective: Vital signs in last 24 hours: Temp:  [97.9 F (36.6 C)-98.9 F (37.2 C)] 98.1 F (36.7 C) (10/01 0732) Pulse Rate:  [55-88] 88 (10/01 0732) Resp:  [16-20] 16 (10/01 0732) BP: (106-182)/(67-90) 106/67 (10/01 0732) SpO2:  [97 %-100 %] 100 % (10/01 0732)  Intake/Output from previous day: 09/30 0701 - 10/01 0700 In: 1240 [P.O.:240; I.V.:1000] Out: 2550 [Urine:1250; Emesis/NG output:1100; Blood:200] Intake/Output this shift: No intake/output data recorded.  No results for input(s): "HGB" in the last 72 hours. No results for input(s): "WBC", "RBC", "HCT", "PLT" in the last 72 hours. No results for input(s): "NA", "K", "CL", "CO2", "BUN", "CREATININE", "GLUCOSE", "CALCIUM" in the last 72 hours. No results for input(s): "LABPT", "INR" in the last 72 hours.  Neurologically intact Neurovascular intact Sensation intact distally Intact pulses distally Dorsiflexion/Plantar flexion intact Incision: dressing C/D/I No cellulitis present Compartment soft   Assessment/Plan: 1 Day Post-Op Procedure(s) (LRB): RIGHT TOTAL HIP ARTHROPLASTY ANTERIOR APPROACH (Right) Advance diet Up with therapy D/C IV fluids Discharge home with home health once cleared by PT WBAT RLE       Cristie Hem 03/19/2023, 10:51 AM

## 2023-03-19 NOTE — Discharge Summary (Signed)
Patient ID: Amanda Mccullough MRN: [id number] DOB/AGE: 22-Oct-1955 67 y.o.  Admit date: 03/18/2023 Discharge date: 03/19/2023  Admission Diagnoses:  Principal Problem:   Primary osteoarthritis of right hip Active Problems:   Status post total replacement of right hip   Discharge Diagnoses:  Same  Past Medical History:  Diagnosis Date   Cataract    left eye - md just watching   Hyperlipidemia    Hypertension    Dr Riley Lam   Iron deficiency anemia    Multiple hemangiomas 10/2020   Osteoarthritis    right hip and left shoulder/upper left arm   Pre-diabetes    SOB (shortness of breath)    history - stress related per patient - saw cardio but all was normal    Surgeries: Procedure(s): RIGHT TOTAL HIP ARTHROPLASTY ANTERIOR APPROACH on 03/18/2023   Consultants:   Discharged Condition: Improved  Hospital Course: Amanda Mccullough is an 67 y.o. female who was admitted 03/18/2023 for operative treatment ofPrimary osteoarthritis of right hip. Patient has severe unremitting pain that affects sleep, daily activities, and work/hobbies. After pre-op clearance the patient was taken to the operating room on 03/18/2023 and underwent  Procedure(s): RIGHT TOTAL HIP ARTHROPLASTY ANTERIOR APPROACH.    Patient was given perioperative antibiotics:  Anti-infectives (From admission, onward)    Start     Dose/Rate Route Frequency Ordered Stop   03/19/23 1000  amoxicillin-clavulanate (AUGMENTIN) 875-125 MG per tablet 1 tablet        1 tablet Oral Every 12 hours 03/18/23 0942     03/18/23 1330  ceFAZolin (ANCEF) IVPB 2g/100 mL premix        2 g 200 mL/hr over 30 Minutes Intravenous Every 6 hours 03/18/23 0942 03/18/23 1913   03/18/23 0754  vancomycin (VANCOCIN) powder  Status:  Discontinued          As needed 03/18/23 0754 03/18/23 0906   03/18/23 0600  ceFAZolin (ANCEF) IVPB 2g/100 mL premix        2 g 200 mL/hr over 30 Minutes Intravenous On call to O.R. 03/18/23 0542 03/18/23 0729    03/18/23 0000  amoxicillin-clavulanate (AUGMENTIN) 875-125 MG tablet        1 tablet Oral 2 times daily 03/18/23 0706 03/28/23 2359        Patient was given sequential compression devices, early ambulation, and chemoprophylaxis to prevent DVT.  Patient benefited maximally from hospital stay and there were no complications.    Recent vital signs: Patient Vitals for the past 24 hrs:  BP Temp Temp src Pulse Resp SpO2  03/19/23 0732 106/67 98.1 F (36.7 C) Oral 88 16 100 %  03/19/23 0240 (!) 147/84 97.9 F (36.6 C) Oral 64 17 97 %  03/18/23 2346 123/72 98.4 F (36.9 C) Oral 65 18 97 %  03/18/23 2029 (!) 159/78 -- -- 61 18 99 %  03/18/23 2027 (!) 160/76 -- -- (!) 58 -- 98 %  03/18/23 1931 (!) 182/84 97.9 F (36.6 C) Oral (!) 57 18 100 %  03/18/23 1546 (!) 145/90 98.9 F (37.2 C) -- 65 20 98 %  03/18/23 1245 (!) 160/85 98.5 F (36.9 C) -- (!) 55 20 100 %     Recent laboratory studies: No results for input(s): "WBC", "HGB", "HCT", "PLT", "NA", "K", "CL", "CO2", "BUN", "CREATININE", "GLUCOSE", "INR", "CALCIUM" in the last 72 hours.  Invalid input(s): "PT", "2"   Discharge Medications:   Allergies as of 03/19/2023       Reactions   Codeine Anaphylaxis,  Hives        Medication List     STOP taking these medications    ADVIL COLD & SINUS LIQUI-GELS PO   diclofenac 75 MG EC tablet Commonly known as: VOLTAREN   diphenhydrAMINE 50 MG capsule Commonly known as: BENADRYL       TAKE these medications    amoxicillin-clavulanate 875-125 MG tablet Commonly known as: AUGMENTIN Take 1 tablet by mouth 2 (two) times daily for 10 days.   aspirin EC 81 MG tablet Take 1 tablet (81 mg total) by mouth 2 (two) times daily. To be taken after surgery to prevent blood clots   CENTRUM SILVER 50+WOMEN PO Take 1 tablet by mouth in the morning.   docusate sodium 100 MG capsule Commonly known as: Colace Take 1 capsule (100 mg total) by mouth daily as needed.   FERROUS SULFATE CR  PO Take 65 mg by mouth in the morning.   fluticasone 50 MCG/ACT nasal spray Commonly known as: FLONASE Place 1-2 sprays into both nostrils daily as needed for allergies.   hydrochlorothiazide 12.5 MG capsule Commonly known as: MICROZIDE Take 1 capsule (12.5 mg total) by mouth daily.   methocarbamol 750 MG tablet Commonly known as: Robaxin-750 Take 1 tablet (750 mg total) by mouth 2 (two) times daily as needed for muscle spasms.   ondansetron 4 MG tablet Commonly known as: Zofran Take 1 tablet (4 mg total) by mouth every 8 (eight) hours as needed for nausea or vomiting.   OVER THE COUNTER MEDICATION Apply 1 application  topically 2 (two) times daily as needed (joint pain). Hempvana Pain Relief Cream   oxyCODONE-acetaminophen 5-325 MG tablet Commonly known as: PERCOCET/ROXICET Take 1-2 tablets by mouth every 6 (six) hours as needed for severe pain. To be taken after surgery   propranolol 20 MG tablet Commonly known as: INDERAL Take 20 mg by mouth in the morning.   rosuvastatin 5 MG tablet Commonly known as: CRESTOR Take 1 tablet (5 mg total) by mouth daily.   simethicone 80 MG chewable tablet Commonly known as: MYLICON Chew 80-160 mg by mouth every 6 (six) hours as needed for flatulence.               Durable Medical Equipment  (From admission, onward)           Start     Ordered   03/18/23 0943  DME Walker rolling  Once       Question:  Patient needs a walker to treat with the following condition  Answer:  History of hip replacement   03/18/23 0942   03/18/23 0943  DME 3 n 1  Once        03/18/23 1610   03/18/23 0943  DME Bedside commode  Once       Question:  Patient needs a bedside commode to treat with the following condition  Answer:  History of hip replacement   03/18/23 0942            Diagnostic Studies: DG Pelvis Portable  Result Date: 03/18/2023 CLINICAL DATA:  Postop right hip arthroplasty. EXAM: PORTABLE PELVIS 1-2 VIEWS COMPARISON:   None Available. FINDINGS: Right hip arthroplasty in expected alignment. No periprosthetic lucency or fracture. Recent postsurgical change includes air and edema in the soft tissues. IMPRESSION: Right hip arthroplasty without immediate postoperative complication. Electronically Signed   By: Narda Rutherford M.D.   On: 03/18/2023 10:27   DG HIP UNILAT WITH PELVIS 1V RIGHT  Result Date: 03/18/2023 CLINICAL  DATA:  Right hip arthroplasty. EXAM: DG HIP (WITH OR WITHOUT PELVIS) 1V RIGHT COMPARISON:  None Available. FINDINGS: Four fluoroscopic spot views of the pelvis and right hip obtained in the operating room. Images during hip arthroplasty. Fluoroscopy time 34 seconds. Dose 3.01 mGy. IMPRESSION: Intraoperative fluoroscopy for right hip arthroplasty. Electronically Signed   By: Narda Rutherford M.D.   On: 03/18/2023 10:27   DG C-Arm 1-60 Min-No Report  Result Date: 03/18/2023 Fluoroscopy was utilized by the requesting physician.  No radiographic interpretation.   DG C-Arm 1-60 Min-No Report  Result Date: 03/18/2023 Fluoroscopy was utilized by the requesting physician.  No radiographic interpretation.    Disposition: Discharge disposition: 01-Home or Self Care          Follow-up Information     Cristie Hem, PA-C. Schedule an appointment as soon as possible for a visit in 2 week(s).   Specialty: Orthopedic Surgery Contact information: [address] [place] [address] [phone]         [facility] Follow up.   Why: [facility]--the home health agency will contact you for the first home visit. Contact information: [address] [place] [address] [phone]                  Signed: Cristie Hem 03/19/2023, 10:52 AM

## 2023-03-19 NOTE — Progress Notes (Signed)
Physical Therapy Treatment  Patient Details Name: Amanda Mccullough MRN: [id number] DOB: September 07, 1955 Today's Date: 03/19/2023   History of Present Illness Pt is a 67 y.o. female who presents to [facility] hospital on 03/18/2023 for elective R THA. PMH includes HTN, insomnia, HLD, sciatica.    PT Comments  Pt progressing with post-op mobility. Reviewed HEP handout and pt was able to demonstrate good technique with supine/seated exercises.  Pt reports minimal pain (2-3 on 0-10 scale) throughout session. Reviewed education, pt completed stair training. Pt is safe for d/c from a mobility standpoint and does not require a second session to prepare for d/c. Pt in agreement and anticipates d/c home early afternoon. Will continue to follow and progress as able per POC.    If plan is discharge home, recommend the following: A little help with walking and/or transfers;A little help with bathing/dressing/bathroom;Assistance with cooking/housework;Assist for transportation;Help with stairs or ramp for entrance   Can travel by private vehicle        Equipment Recommendations  Rolling walker (2 wheels)    Recommendations for Other Services       Precautions / Restrictions Precautions Precautions: Fall Precaution Comments: direct anterior R THA, no hip precautions Restrictions Weight Bearing Restrictions: Yes RLE Weight Bearing: Weight bearing as tolerated     Mobility  Bed Mobility Overal bed mobility: Needs Assistance Bed Mobility: Supine to Sit     Supine to sit: HOB elevated, Used rails, Contact guard     General bed mobility comments: Pt able to transition to EOB without assistance. Increased time and effort required.    Transfers Overall transfer level: Needs assistance Equipment used: Rolling walker (2 wheels) Transfers: Sit to/from Stand Sit to Stand: Supervision           General transfer comment: Close supervision provided for safety as pt powered up to full stand. VC's for wide BOS  and to kick RLE out for comfort if needed.    Ambulation/Gait Ambulation/Gait assistance: Contact guard assist, Supervision Gait Distance (Feet): 350 Feet Assistive device: Rolling walker (2 wheels) Gait Pattern/deviations: Step-through pattern, Decreased dorsiflexion - right Gait velocity: Decreased Gait velocity interpretation: <1.31 ft/sec, indicative of household ambulator   General Gait Details: Initially pt slow and guarded due to pain. As distance progressed, pt appeared more comfortable, able to demonstrate a step-through gait pattern and good heel strike throughout. No knee buckling or LOB noted. VC's throughout for improved posture, closer walker proximity and forward gaze.   Stairs Stairs: Yes Stairs assistance: Contact guard assist, Min assist Stair Management: One rail Right, Step to pattern, Forwards Number of Stairs: 5 General stair comments: VC's for sequencing and general safety. HHA provided on the L as pt could not reach both rails as she can at home. Light assist to CGA provided throughout.   Wheelchair Mobility     Tilt Bed    Modified Rankin (Stroke Patients Only)       Balance Overall balance assessment: Needs assistance Sitting-balance support: No upper extremity supported, Feet supported Sitting balance-Leahy Scale: Good     Standing balance support: Bilateral upper extremity supported, Reliant on assistive device for balance Standing balance-Leahy Scale: Poor                              Cognition Arousal: Alert Behavior During Therapy: WFL for tasks assessed/performed Overall Cognitive Status: Within Functional Limits for tasks assessed  Exercises Total Joint Exercises Ankle Circles/Pumps: 10 reps Quad Sets: 10 reps Short Arc Quad: 10 reps Heel Slides: 10 reps Hip ABduction/ADduction: 10 reps Long Arc Quad: 10 reps    General Comments        Pertinent  Vitals/Pain Pain Assessment Pain Assessment: 0-10 Pain Score: 3  Pain Location: R hip Pain Descriptors / Indicators: Sore, Operative site guarding Pain Intervention(s): Limited activity within patient's tolerance, Monitored during session, Repositioned    Home Living                          Prior Function            PT Goals (current goals can now be found in the care plan section) Acute Rehab PT Goals Patient Stated Goal: to return to independence PT Goal Formulation: With patient Time For Goal Achievement: 03/22/23 Potential to Achieve Goals: Good Progress towards PT goals: Progressing toward goals    Frequency    Min 1X/week      PT Plan      Co-evaluation              AM-PAC PT "6 Clicks" Mobility   Outcome Measure  Help needed turning from your back to your side while in a flat bed without using bedrails?: A Little Help needed moving from lying on your back to sitting on the side of a flat bed without using bedrails?: A Little Help needed moving to and from a bed to a chair (including a wheelchair)?: A Little Help needed standing up from a chair using your arms (e.g., wheelchair or bedside chair)?: A Little Help needed to walk in hospital room?: A Little Help needed climbing 3-5 steps with a railing? : A Little 6 Click Score: 18    End of Session Equipment Utilized During Treatment: Gait belt Activity Tolerance: Patient limited by pain Patient left: in bed;with call bell/phone within reach;with family/visitor present Nurse Communication: Mobility status PT Visit Diagnosis: Other abnormalities of gait and mobility (R26.89);Muscle weakness (generalized) (M62.81);Pain Pain - Right/Left: Right Pain - part of body: Hip     Time: 0921-0957 PT Time Calculation (min) (ACUTE ONLY): 36 min  Charges:    $Gait Training: 8-22 mins $Therapeutic Exercise: 8-22 mins PT General Charges $$ ACUTE PT VISIT: 1 Visit                     Conni Slipper, PT, DPT Acute Rehabilitation Services Secure Chat Preferred Office: [phone]    [name] 03/19/2023, 11:22 AM

## 2023-03-20 ENCOUNTER — Encounter (HOSPITAL_COMMUNITY): Payer: Self-pay | Admitting: Orthopaedic Surgery

## 2023-04-02 ENCOUNTER — Ambulatory Visit: Payer: Medicare Other | Admitting: Physician Assistant

## 2023-04-02 DIAGNOSIS — Z96641 Presence of right artificial hip joint: Secondary | ICD-10-CM

## 2023-04-02 MED ORDER — OXYCODONE-ACETAMINOPHEN 5-325 MG PO TABS: 1.0000 | ORAL_TABLET | Freq: Two times a day (BID) | ORAL | 0 refills | Status: DC | PRN

## 2023-04-02 MED ORDER — METHOCARBAMOL 750 MG PO TABS: 750.0000 mg | ORAL_TABLET | Freq: Two times a day (BID) | ORAL | 2 refills | Status: DC | PRN

## 2023-04-02 NOTE — Progress Notes (Signed)
Post-Op Visit Note   Patient: Amanda Mccullough           Date of Birth: 11/26/1955           MRN: [id number] Visit Date: 04/02/2023 PCP: Collene Mares, PA   Assessment & Plan:  Chief Complaint:  Chief Complaint  Patient presents with   Right Hip - Follow-up    Right total hip arthroplasty 03/18/2023   Visit Diagnoses:  1. Status post total replacement of right hip     Plan: Patient is a pleasant 67 year old female who comes in today 2 weeks status post right total hip replacement 03/18/2023.  She has been doing well.  She is taking a muscle relaxer during the day and up oxycodone at night.  She has finished home health physical therapy and is ambulating with a walker.  She has been compliant taking baby aspirin twice daily for DVT prophylaxis.  Examination of the right hip reveals well-healing surgical incision with nylon sutures in place.  No evidence of infection or cellulitis.  Calf soft nontender.  She is neurovascular intact distally.  Today, sutures were removed and Steri-Strips applied.  I have refilled her Robaxin and oxycodone.  Continue taking a baby aspirin twice daily for DVT prophylaxis.  Postop instructions provided.  Follow-up in 4 weeks for repeat evaluation and AP pelvis x-rays.  Call with concerns or questions.  Follow-Up Instructions: Return in about 4 weeks (around 04/30/2023).   Orders:  No orders of the defined types were placed in this encounter.  Meds ordered this encounter  Medications   oxyCODONE-acetaminophen (PERCOCET/ROXICET) 5-325 MG tablet    Sig: Take 1-2 tablets by mouth 2 (two) times daily as needed for severe pain (pain score 7-10). To be taken after surgery    Dispense:  30 tablet    Refill:  0   methocarbamol (ROBAXIN-750) 750 MG tablet    Sig: Take 1 tablet (750 mg total) by mouth 2 (two) times daily as needed for muscle spasms.    Dispense:  20 tablet    Refill:  2    Imaging: No new imaging  PMFS History: Patient Active Problem List    Diagnosis Date Noted   Status post total replacement of right hip 03/18/2023   Primary osteoarthritis of right hip 12/16/2022   History of concussion 06/08/2021   Essential hypertension 10/12/2020   Iron deficiency anemia 10/12/2020   Mixed hyperlipidemia 10/12/2020   Sciatica 10/12/2020   DOE (dyspnea on exertion) 06/15/2020   Insomnia 03/10/2013   Past Medical History:  Diagnosis Date   Cataract    left eye - md just watching   Hyperlipidemia    Hypertension    Dr Riley Lam   Iron deficiency anemia    Multiple hemangiomas 10/2020   Osteoarthritis    right hip and left shoulder/upper left arm   Pre-diabetes    SOB (shortness of breath)    history - stress related per patient - saw cardio but all was normal    Family History  Problem Relation Age of Onset   Hypertension Mother    Thyroid disease Mother    Glaucoma Mother    Hypertension Father    Cancer - Prostate Father    Hypertension Sister    Heart disease Sister    Thyroid disease Sister    Glaucoma Sister    Hypertension Sister    Hypertension Sister     Past Surgical History:  Procedure Laterality Date   BIOPSY BREAST  Right 2011   benign   COLONOSCOPY     DIAGNOSTIC LAPAROSCOPY  1994   endometriosis and scar tissue removal   MYOMECTOMY ABDOMINAL APPROACH     x 1 -  1991, 2001   TONSILLECTOMY     As a teenager   TOTAL HIP ARTHROPLASTY Right 03/18/2023   Procedure: RIGHT TOTAL HIP ARTHROPLASTY ANTERIOR APPROACH;  Surgeon: Tarry Kos, MD;  Location: [facility] OR;  Service: Orthopedics;  Laterality: Right;  3-C   WISDOM TOOTH EXTRACTION     Social History   Occupational History   Not on file  Tobacco Use   Smoking status: Never   Smokeless tobacco: Never  Vaping Use   Vaping status: Never Used  Substance and Sexual Activity   Alcohol use: Yes    Comment: occasional wine   Drug use: Not Currently   Sexual activity: Not Currently    Birth control/protection: None

## 2023-04-30 ENCOUNTER — Ambulatory Visit (INDEPENDENT_AMBULATORY_CARE_PROVIDER_SITE_OTHER): Payer: Medicare Other | Admitting: Orthopaedic Surgery

## 2023-04-30 ENCOUNTER — Other Ambulatory Visit (INDEPENDENT_AMBULATORY_CARE_PROVIDER_SITE_OTHER): Payer: Medicare Other

## 2023-04-30 DIAGNOSIS — Z96641 Presence of right artificial hip joint: Secondary | ICD-10-CM

## 2023-04-30 NOTE — Progress Notes (Signed)
Post-Op Visit Note   Patient: Amanda Mccullough           Date of Birth: [birth year]           MRN: [id number] Visit Date: 04/30/2023 PCP: Collene Mares, PA   Assessment & Plan:  Chief Complaint:  Chief Complaint  Patient presents with   Right Hip - Routine Post Op   Visit Diagnoses:  1. Status post total replacement of right hip     Plan: 6 week THA follow up plan  Patient presents for follow up 6 weeks status post right total hip replacement.  She is happy with the outcome so far.  She does not have any real complaints.  The wound is healed and there is no evidence of infection. TED hose may be discontinued. Radiographs reveal a total hip arthroplasty in good position, with no evidence of subsidence, loosening, or complicating features.   She may increase her activity as tolerated and we talked about the inherent risks involved with activities such as skiing.  It was reinforced that with any procedure including dental work, colonoscopy, or any invasive procedure that pre-procedural prophylactic antibiotics must be taken to decrease the risk of infection. Reminders were given about signs to be aware of including redness, drainage, increased pain, fevers, calf pain, shortness of breath, or any concern should generate a phone call or a return to see [place] immediately. Will plan to follow up at 3 months postoperatively.  All questions answered to her satisfaction.   Follow-Up Instructions: Return in about 6 weeks (around 06/11/2023) for with lindsey.   Orders:  Orders Placed This Encounter  Procedures   XR Pelvis 1-2 Views   No orders of the defined types were placed in this encounter.   Imaging: XR Pelvis 1-2 Views  Result Date: 04/30/2023 Stable right total hip replacement without complications   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   Primary osteoarthritis of right hip 12/16/2022    History of concussion 06/08/2021   Essential hypertension 10/12/2020   Iron deficiency anemia 10/12/2020   Mixed hyperlipidemia 10/12/2020   Sciatica 10/12/2020   DOE (dyspnea on exertion) 06/15/2020   Insomnia 03/10/2013   Past Medical History:  Diagnosis Date   Cataract    left eye - md just watching   Hyperlipidemia    Hypertension    Dr Riley Lam   Iron deficiency anemia    Multiple hemangiomas 10/2020   Osteoarthritis    right hip and left shoulder/upper left arm   Pre-diabetes    SOB (shortness of breath)    history - stress related per patient - saw cardio but all was normal    Family History  Problem Relation Age of Onset   Hypertension Mother    Thyroid disease Mother    Glaucoma Mother    Hypertension Father    Cancer - Prostate Father    Hypertension Sister    Heart disease Sister    Thyroid disease Sister    Glaucoma Sister    Hypertension Sister    Hypertension Sister     Past Surgical History:  Procedure Laterality Date   BIOPSY BREAST Right 2011   benign   COLONOSCOPY     DIAGNOSTIC LAPAROSCOPY  1994   endometriosis and scar tissue removal   MYOMECTOMY ABDOMINAL APPROACH     x 1 -  1991, 2001   TONSILLECTOMY  As a teenager   TOTAL HIP ARTHROPLASTY Right 03/18/2023   Procedure: RIGHT TOTAL HIP ARTHROPLASTY ANTERIOR APPROACH;  Surgeon: Tarry Kos, MD;  Location: [facility] OR;  Service: Orthopedics;  Laterality: Right;  3-C   WISDOM TOOTH EXTRACTION     Social History   Occupational History   Not on file  Tobacco Use   Smoking status: Never   Smokeless tobacco: Never  Vaping Use   Vaping status: Never Used  Substance and Sexual Activity   Alcohol use: Yes    Comment: occasional wine   Drug use: Not Currently   Sexual activity: Not Currently    Birth control/protection: None

## 2023-06-04 ENCOUNTER — Ambulatory Visit (INDEPENDENT_AMBULATORY_CARE_PROVIDER_SITE_OTHER): Payer: Medicare Other | Admitting: Physician Assistant

## 2023-06-04 ENCOUNTER — Encounter: Payer: Self-pay | Admitting: Physician Assistant

## 2023-06-04 DIAGNOSIS — Z96641 Presence of right artificial hip joint: Secondary | ICD-10-CM

## 2023-06-04 NOTE — Progress Notes (Signed)
Post-Op Visit Note   Patient: Amanda Mccullough           Date of Birth: Jan 18, 1956           MRN: [id number] Visit Date: 06/04/2023 PCP: Collene Mares, PA   Assessment & Plan:  Chief Complaint:  Chief Complaint  Patient presents with   Right Hip - Follow-up    Right total hip arthroplasty 03/18/2023   Visit Diagnoses:  1. Status post total replacement of right hip     Plan: Patient is a pleasant 67 year old female who comes in today 3 months status post right total hip replacement 03/18/2023.  She has been doing well.  She has developed a keloid which is sensitive at times.  She is applying scar cream to this.  No other complaints.  Examination of the right hip reveals a painless hip flexion and logroll.  She is neurovascularly intact distally.  At this point, she will continue to advance with activity as tolerated.  Dental prophylaxis reinforced.  Follow-up in 3 months for repeat evaluation and AP pelvis x-rays.  Call with concerns or questions.  Follow-Up Instructions: Return in about 3 months (around 09/02/2023).   Orders:  No orders of the defined types were placed in this encounter.  No orders of the defined types were placed in this encounter.   Imaging: No new imaging  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   Primary osteoarthritis of right hip 12/16/2022   History of concussion 06/08/2021   Essential hypertension 10/12/2020   Iron deficiency anemia 10/12/2020   Mixed hyperlipidemia 10/12/2020   Sciatica 10/12/2020   DOE (dyspnea on exertion) 06/15/2020   Insomnia 03/10/2013   Past Medical History:  Diagnosis Date   Cataract    left eye - md just watching   Hyperlipidemia    Hypertension    Dr Riley Lam   Iron deficiency anemia    Multiple hemangiomas 10/2020   Osteoarthritis    right hip and left shoulder/upper left arm   Pre-diabetes    SOB (shortness of  breath)    history - stress related per patient - saw cardio but all was normal    Family History  Problem Relation Age of Onset   Hypertension Mother    Thyroid disease Mother    Glaucoma Mother    Hypertension Father    Cancer - Prostate Father    Hypertension Sister    Heart disease Sister    Thyroid disease Sister    Glaucoma Sister    Hypertension Sister    Hypertension Sister     Past Surgical History:  Procedure Laterality Date   BIOPSY BREAST Right 2011   benign   COLONOSCOPY     DIAGNOSTIC LAPAROSCOPY  1994   endometriosis and scar tissue removal   MYOMECTOMY ABDOMINAL APPROACH     x 1 -  1991, 2001   TONSILLECTOMY     As a teenager   TOTAL HIP ARTHROPLASTY Right 03/18/2023   Procedure: RIGHT TOTAL HIP ARTHROPLASTY ANTERIOR APPROACH;  Surgeon: Tarry Kos, MD;  Location: [facility] OR;  Service: Orthopedics;  Laterality: Right;  3-C   WISDOM TOOTH EXTRACTION     Social History   Occupational History   Not on file  Tobacco Use   Smoking status: Never   Smokeless tobacco: Never  Vaping Use   Vaping status: Never Used  Substance and Sexual Activity  Alcohol use: Yes    Comment: occasional wine   Drug use: Not Currently   Sexual activity: Not Currently    Birth control/protection: None

## 2023-07-03 ENCOUNTER — Other Ambulatory Visit: Payer: Self-pay | Admitting: Internal Medicine

## 2023-08-12 ENCOUNTER — Ambulatory Visit: Payer: Medicare Other | Attending: Internal Medicine | Admitting: Internal Medicine

## 2023-08-12 VITALS — BP 120/76 | HR 69 | Ht 62.0 in | Wt 142.0 lb

## 2023-08-12 DIAGNOSIS — D508 Other iron deficiency anemias: Secondary | ICD-10-CM | POA: Diagnosis present

## 2023-08-12 DIAGNOSIS — E782 Mixed hyperlipidemia: Secondary | ICD-10-CM | POA: Diagnosis present

## 2023-08-12 DIAGNOSIS — I10 Essential (primary) hypertension: Secondary | ICD-10-CM | POA: Diagnosis not present

## 2023-08-12 NOTE — Patient Instructions (Signed)

## 2023-08-12 NOTE — Progress Notes (Signed)
 Cardiology Office Note:    Date:  08/12/2023   ID:  Amanda Mccullough, DOB [birth year], MRN [id number]  PCP:  Collene Mares, PA  [facility] Cardiologist:  Riley Lam MD [facility] Electrophysiologist:  None   Referring MD: Hyacinth Meeker, [place] E, PA   CC: BP f/u  History of Present Illness:    Amanda Mccullough is a 68 y.o. female with a hx of HTN, HLD, IDA who presents for evaluation 06/15/20.   2021: patient CCTA and [place] and MRI without suspicious findings.  2022: started HCTZ 2023: Took her mom to [place]  [name] is seen for one year follow up for primary prevention.  Her hyperlipidemia is managed with rosuvastatin, maintaining her LDL levels under 100. A cardiac CT in 2021 showed no significant issues, and her cholesterol management remains effective.  She has a history of mild normocytic anemia, which does not require transfusion or IV iron. She takes an iron supplement. No significant fatigue or bleeding issues are present.  She has had prediabetes for the past 10-12 years, with her A1c slightly above the target but below 6.5. She experienced weight loss following a hip replacement, attributed to decreased appetite and smaller meal portions, losing about 10-12 pounds and maintaining her weight.  She reports a recent scratchy throat due to weather changes affecting her sinuses. Additionally, she has experienced emotional distress due to the recent loss of three pets, which has been difficult for her and her mother.   Past Medical History:  Diagnosis Date   Cataract    left eye - md just watching   Hyperlipidemia    Hypertension    Dr Riley Lam   Iron deficiency anemia    Multiple hemangiomas 10/2020   Osteoarthritis    right hip and left shoulder/upper left arm   Pre-diabetes    SOB (shortness of breath)    history - stress related per patient - saw cardio but all was normal    Past Surgical History:  Procedure Laterality Date   BIOPSY  BREAST Right 2011   benign   COLONOSCOPY     DIAGNOSTIC LAPAROSCOPY  1994   endometriosis and scar tissue removal   MYOMECTOMY ABDOMINAL APPROACH     x 1 -  1991, 2001   TONSILLECTOMY     As a teenager   TOTAL HIP ARTHROPLASTY Right 03/18/2023   Procedure: RIGHT TOTAL HIP ARTHROPLASTY ANTERIOR APPROACH;  Surgeon: Tarry Kos, MD;  Location: [facility];  Service: Orthopedics;  Laterality: Right;  3-C   WISDOM TOOTH EXTRACTION      Current Medications: Current Meds  Medication Sig   DENTA 5000 PLUS 1.1 % CREA dental cream at bedtime.   diclofenac (VOLTAREN) 75 MG EC tablet daily at 6 (six) AM.   Ferrous Sulfate Dried (FERROUS SULFATE CR PO) Take 65 mg by mouth in the morning.   fluticasone (FLONASE) 50 MCG/ACT nasal spray Place 1-2 sprays into both nostrils daily as needed for allergies.   hydrochlorothiazide (MICROZIDE) 12.5 MG capsule TAKE 1 CAPSULE BY MOUTH EVERY DAY   Multiple Vitamins-Minerals (CENTRUM SILVER 50+WOMEN PO) Take 1 tablet by mouth in the morning.   naproxen sodium (ALEVE) 220 MG tablet as needed (pain).   OVER THE COUNTER MEDICATION Apply 1 application  topically 2 (two) times daily as needed (joint pain). Hempvana Pain Relief Cream   propranolol (INDERAL) 20 MG tablet Take 20 mg by mouth in the morning.   rosuvastatin (CRESTOR) 5 MG tablet Take 1  tablet (5 mg total) by mouth daily.   simethicone (MYLICON) 80 MG chewable tablet Chew 80-160 mg by mouth every 6 (six) hours as needed for flatulence.     Allergies:   Codeine   Social History   Socioeconomic History   Marital status: Single    Spouse name: Not on file   Number of children: Not on file   Years of education: Not on file   Highest education level: Not on file  Occupational History   Not on file  Tobacco Use   Smoking status: Never   Smokeless tobacco: Never  Vaping Use   Vaping status: Never Used  Substance and Sexual Activity   Alcohol use: Yes    Comment: occasional wine   Drug use: Not  Currently   Sexual activity: Not Currently    Birth control/protection: None  Other Topics Concern   Not on file  Social History Narrative   Not on file   Social Drivers of Health   Financial Resource Strain: Not on file  Food Insecurity: Not on file  Transportation Needs: Not on file  Physical Activity: Not on file  Stress: Not on file (07/31/2019)  Social Connections: Not on file    Social: She is from [place]  Family History: The patient's family history includes Cancer - Prostate in her father; Glaucoma in her mother and sister; Heart disease in her sister; Hypertension in her father, mother, sister, sister, and sister; Thyroid disease in her mother and sister. History of coronary artery disease notable for no members. History of heart failure notable for sister with CHF. History of arrhythmia notable for no members.  ROS:   Please see the history of present illness.     EKGs/Labs/Other Studies Reviewed:    The following studies were reviewed today:  Cardiac Studies & Procedures   ______________________________________________________________________________________________          CT SCANS  CT CORONARY MORPH W/CTA COR W/SCORE 07/14/2020  Addendum 07/14/2020  3:56 PM ADDENDUM REPORT: 07/14/2020 15:54  EXAM: Cardiac/Coronary  CT  TECHNIQUE: The patient was scanned on a [employer].  FINDINGS: A 120 kV prospective scan was triggered in the descending thoracic aorta at 111 HU's. Axial non-contrast 3 mm slices were carried out through the heart. The data set was analyzed on a dedicated work station and scored using the Agatson method. Gantry rotation speed was 250 msecs and collimation was .6 mm. No beta blockade and 0.8 mg of sl NTG was given. The 3D data set was reconstructed in 5% intervals of the 67-82 % of the R-R cycle. Diastolic phases were analyzed on a dedicated work station using MPR, MIP and VRT modes. The patient received 80 cc of  contrast.  Aorta:  Normal size.  No calcifications.  No dissection.  Aortic Valve:  Trileaflet.  No calcifications.  Coronary Arteries:  Normal coronary origin.  Left dominance.  RCA is a small non dominant artery that gives rise to a large branching acute RV marginal vessel. There is no plaque.  Left main is a large artery that gives rise to LAD and LCX arteries. There is no plaque.  LAD is a large vessel that gives rise to a moderate sized diagaonal. There is no plaque.  LCX is a dominant artery that gives rise to moderate OM1 and OM2 branches as well as LPLA and LPDA. There is no plaque.  Other findings:  Normal pulmonary vein drainage into the left atrium.  Normal let atrial appendage without  a thrombus.  Normal size of the pulmonary artery.  IMPRESSION: 1. Coronary calcium score of 0. This was 0 percentile for age and sex matched control.  2.  Normal coronary origin with left dominance.  3.  No evidence of CAD.  4.  Consider non atherosclerotic causes of chest pain.  [name]   Electronically Signed By: [name] On: 07/14/2020 15:54  Narrative EXAM: OVER-READ INTERPRETATION  CT CHEST  The following report is an over-read performed by radiologist Dr. Charlett Nose of [facility] on 07/14/2020. This over-read does not include interpretation of cardiac or coronary anatomy or pathology. The coronary CTA interpretation by the cardiologist is attached.  COMPARISON:  None.  FINDINGS: Vascular: Heart is normal size. Aorta normal caliber. Scattered calcifications in the aortic arch.  Mediastinum/Nodes: No adenopathy  Lungs/Pleura: No confluent opacities or effusions.  Upper Abdomen: Low-density lesion within the right hepatic lobe measures up to 5 cm. There appears to be early peripheral puddling of contrast suggesting this is a hemangioma.  Musculoskeletal: Chest wall soft tissues are unremarkable. No acute bony  abnormality.  IMPRESSION: 5 cm lesion within the liver with apparent early peripheral puddling of contrast suggesting hemangioma. This could be confirmed with hepatic protocol MRI or right upper quadrant ultrasound.  Electronically Signed: By: Charlett Nose M.D. On: 07/14/2020 14:58     ______________________________________________________________________________________________       Recent Labs: 03/08/2023: BUN 20; Creatinine, Ser 0.87; Hemoglobin 10.7; Platelets 276; Potassium 3.7; Sodium 137  Recent Lipid Panel    Component Value Date/Time   CHOL 173 09/05/2021 0905   TRIG 169 (H) 09/05/2021 0905   HDL 61 09/05/2021 0905   CHOLHDL 2.8 09/05/2021 0905   LDLCALC 83 09/05/2021 0905   Physical Exam:    VS:  BP 120/76 (BP Location: Left Arm)   Pulse 69   Ht 5\' 2"  (1.575 m)   Wt 142 lb (64.4 kg)   LMP  (LMP Unknown)   SpO2 97%   BMI 25.97 kg/m     Wt Readings from Last 3 Encounters:  08/12/23 142 lb (64.4 kg)  03/18/23 148 lb (67.1 kg)  03/08/23 148 lb 6.4 oz (67.3 kg)    Gen: no distress  Neck: No JVD Cardiac: No Rubs or Gallops, no murmur, regular, +2 radial pulses Respiratory: Clear to auscultation bilaterally, normal effort, normal  respiratory rate GI: Soft, nontender, non-distended  MS: No  edema;  moves all extremities Integument: Skin feels warm Neuro:  At time of evaluation, alert and oriented to person/place/time/situation  Psych: Normal affect, patient feels OK   ASSESSMENT:    1. Essential hypertension   2. Mixed hyperlipidemia   3. Other iron deficiency anemia      PLAN:    Hypertension Hypertension is well-controlled on hydrochlorothiazide. Blood pressure readings are within the target range. Discussed benefits of continuing current medication and risks of discontinuation, including increased blood pressure and cardiovascular risks. - Continue hydrochlorothiazide.  Hyperlipidemia Hyperlipidemia is managed with rosuvastatin. LDL levels  are under 100, meeting the goal. Discussed benefits of maintaining LDL under 100 to reduce cardiovascular event risk. No evidence of cardiac blockages on previous cardiac CT. - Continue rosuvastatin.  Pre-Diabetes A1c is slightly elevated but remains under 6.5. Long history of pre-diabetes. Discussed importance of monitoring A1c levels to prevent progression to diabetes. - Monitor A1c levels.  Normocytic Anemia Mild normocytic anemia with no symptoms of fatigue or bleeding. Hemoglobin levels are low but not critical. Discussed potential need for  further evaluation if symptoms worsen, including possible GI referral for colonoscopy. Advised on benefits of taking iron with vitamin C to improve absorption. - Monitor for symptoms of fatigue or bleeding. - Consider further evaluation if symptoms worsen, including possible GI referral for colonoscopy.  General Health Maintenance Overall health is stable with no evidence of cardiac blockages. Discussed benefits of maintaining current lifestyle modifications and medications to prevent cardiovascular events. - Continue current medications and lifestyle modifications. - Consider annual follow-up visits.  Follow-up - Schedule follow-up visit in one year (offered graduation from cardiology; she declined)   Medication Adjustments/Labs and Tests Ordered: Current medicines are reviewed at length with the patient today.  Concerns regarding medicines are outlined above.  Orders Placed This Encounter  Procedures   EKG 12-Lead   No orders of the defined types were placed in this encounter.   Patient Instructions  Medication Instructions:  Your physician recommends that you continue on your current medications as directed. Please refer to the Current Medication list given to you today.  *If you need a refill on your cardiac medications before your next appointment, please call your pharmacy*   Lab Work: NONE If you have labs (blood work) drawn  today and your tests are completely normal, you will receive your results only by: MyChart Message (if you have MyChart) OR A paper copy in the mail If you have any lab test that is abnormal or we need to change your treatment, we will call you to review the results.   Testing/Procedures: NONE   Follow-Up: At [facility], you and your health needs are our priority.  As part of our continuing mission to provide you with exceptional heart care, we have created designated Provider Care Teams.  These Care Teams include your primary Cardiologist (physician) and Advanced Practice Providers (APPs -  Physician Assistants and Nurse Practitioners) who all work together to provide you with the care you need, when you need it.   Your next appointment:   1 year(s)  Provider:   Riley Lam, MD       Signed, Christell Constant, MD  08/12/2023 4:59 PM    [facility]

## 2023-08-21 ENCOUNTER — Ambulatory Visit: Payer: Medicare Other | Admitting: Allergy

## 2023-08-26 ENCOUNTER — Encounter: Payer: Self-pay | Admitting: Orthopaedic Surgery

## 2023-08-26 ENCOUNTER — Other Ambulatory Visit: Payer: Self-pay

## 2023-08-26 MED ORDER — AMOXICILLIN 500 MG PO TABS: ORAL_TABLET | ORAL | 2 refills | Status: DC

## 2023-08-30 ENCOUNTER — Other Ambulatory Visit: Payer: Self-pay

## 2023-08-30 ENCOUNTER — Ambulatory Visit (INDEPENDENT_AMBULATORY_CARE_PROVIDER_SITE_OTHER): Admitting: Allergy

## 2023-08-30 ENCOUNTER — Encounter: Payer: Self-pay | Admitting: Allergy

## 2023-08-30 VITALS — BP 108/80 | HR 88 | Temp 98.1°F | Resp 19 | Ht 60.25 in | Wt 143.5 lb

## 2023-08-30 DIAGNOSIS — J329 Chronic sinusitis, unspecified: Secondary | ICD-10-CM | POA: Diagnosis not present

## 2023-08-30 MED ORDER — XHANCE 93 MCG/ACT NA EXHU: INHALANT_SUSPENSION | NASAL | Status: DC

## 2023-08-30 NOTE — Patient Instructions (Signed)
 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 Nonallergic rhinitis history with negative allergy testing in the past. Symptoms worsened after relocation back to [place] 5 years ago. Current management with Flonase and Advil Cold and Sinus is suboptimal.  - Order blood test for allergy testing to evaluate transition to allergic rhinitis. If negative will get scheduled for skin testing.  - Provided Xhance samples for trial to assess effectiveness in managing symptoms.   Amanda Mccullough is a special nasal device that allows for deeper deposition of the medication to your sinuses as well as concentrates it in your nasal cavity.   Use 2 sprays each nostril 1-2 times a day depending on symptom severity.  Use for 1-2 weeks at a time before stopping once symptoms improve.   - Recommend saline rinse (NAVAGE) during symptomatic periods, ensuring use of distilled or boiled water.  If can do more consistently several times a week is helpful to keep sinus clear/clean  Follow-up in 3-4 months or sooner if needed

## 2023-08-30 NOTE — Progress Notes (Signed)
 Medication Samples have been provided to the patient.  Drug name: Timmothy Sours       Strength:        Qty: 2  LOT: [id number]  Exp.Date: 07/18/2024  Dosing instructions: 2 sprays 1-2 times a day depending on severity of symptoms.  The patient has been instructed regarding the correct time, dose, and frequency of taking this medication, including desired effects and most common side effects.   Glena Norfolk 2:18 PM 08/30/2023

## 2023-08-30 NOTE — Progress Notes (Signed)
 New Patient Note  RE: Amanda Mccullough MRN: [id number] DOB: 01-04-1956 Date of Office Visit: 08/30/2023  Primary care provider: Collene Mares, PA  Chief Complaint: Allergies  History of present illness: Amanda Mccullough is a 68 y.o. female presenting today for evaluation of allergic rhinitis. Discussed the use of AI scribe software for clinical note transcription with the patient, who gave verbal consent to proceed.  She experiences worsening sinus pressure and pain, sometimes accompanied by headaches, without drainage or rhinorrhea. Occasionally, she has otalgia, particularly in lt ear, but no popping or fullness. Sneezing is infrequent, and she does not often need to clear her throat. Weather changes exacerbate her symptoms, causing a buildup of pressure.  For management, she uses Flonase as needed, which she has been using for over twenty years. She finds it less effective than before, requiring multiple applications to achieve relief. She also uses Advil Cold and Sinus over-the-counter, which she finds effective. She does not regularly use other allergy medications like Zyrtec.  She has a history of nonallergic rhinitis, with testing in the 1990s showing negative results for allergies. She moved back to her current location five years ago, and since then, her sinus symptoms have worsened. She has a history of frequent sinus infections severe enough to require FMLA leave when she was working, but these decreased before her move. Since relocating, she has not had a sinus infection requiring antibiotics.  Her medication list includes amoxicillin, which she takes prophylactically before dental visits due to a hip replacement surgery on September 30th. She is scheduled for a dental visit next Wednesday.     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   Osteoarthritis    right hip and left shoulder/upper left arm   Pre-diabetes    SOB (shortness of breath)    history - stress related per patient - saw cardio but all was normal    Past surgical history: Past Surgical History:  Procedure Laterality Date   ADENOIDECTOMY     BIOPSY BREAST Right 2011   benign   COLONOSCOPY     DIAGNOSTIC LAPAROSCOPY  1994   endometriosis and scar tissue removal   MYOMECTOMY ABDOMINAL APPROACH     x 1 -  1991, 2001   TONSILLECTOMY     As a teenager   TOTAL HIP ARTHROPLASTY Right 03/18/2023   Procedure: RIGHT TOTAL HIP ARTHROPLASTY ANTERIOR APPROACH;  Surgeon: Tarry Kos, MD;  Location: [facility];  Service: Orthopedics;  Laterality: Right;  3-C   WISDOM TOOTH EXTRACTION      Family history:  Family History  Problem Relation Age of Onset   Hypertension Mother    Thyroid disease Mother    Glaucoma Mother    Hypertension Father    Cancer - Prostate Father    Hypertension Sister    Heart disease Sister    Thyroid disease Sister    Glaucoma Sister    Hypertension Sister    Hypertension Sister     Social history: Lives in a home with carpeting in the bedroom with gas heating and central cooling.  Cats in the home.  There was a dog in the home up until Dec 2024.  Cats  outside the home.  No concern for water damage, mildew or roaches in the home.  She is a retired Financial controller.  Denies smoking history.    Medication List: Current Outpatient Medications  Medication Sig Dispense Refill   amoxicillin (AMOXIL) 500 MG tablet Take 4 tablets by mouth 1 hour prior to dental procedure. 4 tablet 2   DENTA 5000 PLUS 1.1 % CREA dental cream at bedtime.     diclofenac (VOLTAREN) 75 MG EC tablet daily at 6 (six) AM.     Ferrous Sulfate Dried (FERROUS SULFATE CR PO) Take 65 mg by mouth in the morning.     fluticasone (FLONASE) 50 MCG/ACT nasal spray  Place 1-2 sprays into both nostrils daily as needed for allergies.     hydrochlorothiazide (MICROZIDE) 12.5 MG capsule TAKE 1 CAPSULE BY MOUTH EVERY DAY 90 capsule 0   Multiple Vitamins-Minerals (CENTRUM SILVER 50+WOMEN PO) Take 1 tablet by mouth in the morning.     naproxen sodium (ALEVE) 220 MG tablet as needed (pain).     OVER THE COUNTER MEDICATION Apply 1 application  topically 2 (two) times daily as needed (joint pain). Hempvana Pain Relief Cream     propranolol (INDERAL) 20 MG tablet Take 20 mg by mouth in the morning.     rosuvastatin (CRESTOR) 5 MG tablet Take 1 tablet (5 mg total) by mouth daily. 90 tablet 0   simethicone (MYLICON) 80 MG chewable tablet Chew 80-160 mg by mouth every 6 (six) hours as needed for flatulence.     No current facility-administered medications for this visit.    Known medication allergies: Allergies  Allergen Reactions   Codeine Anaphylaxis and Hives     Physical examination: Blood pressure 108/80, pulse 88, temperature 98.1 F (36.7 C), temperature source Temporal, resp. rate 19, height 5' 0.25" (1.53 m), weight 143 lb 8 oz (65.1 kg), SpO2 99%.  General: Alert, interactive, in no acute distress. HEENT: PERRLA, TMs pearly gray, turbinates mildly edematous without discharge, post-pharynx non erythematous. Neck: Supple without lymphadenopathy. Lungs: Clear to auscultation without wheezing, rhonchi or rales. {no increased work of breathing. CV: Normal S1, S2 without murmurs. Abdomen: Nondistended, nontender. Skin: Warm and dry, without lesions or rashes. Extremities:  No clubbing, cyanosis or edema. Neuro:   Grossly intact.  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 Nonallergic rhinitis history with negative allergy testing in the past. Symptoms worsened after relocation back to [place] 5 years ago. Current management with Flonase and Advil  Cold and Sinus is suboptimal.  - Order blood test for allergy testing to evaluate transition to allergic rhinitis. If negative will get scheduled for skin testing.  - Provided Xhance samples for trial to assess effectiveness in managing symptoms.   Timmothy Sours is a special nasal device that allows for deeper deposition of the medication to your sinuses as well as concentrates it in your nasal cavity.   Use 2 sprays each nostril 1-2 times a day depending on symptom severity.  Use for 1-2 weeks at a time before stopping once symptoms improve.   - Recommend saline rinse (NAVAGE) during symptomatic periods, ensuring use of distilled or boiled water.  If can do more consistently several times a week is helpful to keep sinus clear/clean  Follow-up in 3-4 months or sooner if needed  I appreciate the opportunity to take part in Amanda Mccullough's care. Please do not hesitate to contact me with questions.  Sincerely,   Amanda Mccullough  Amanda Lek, MD Allergy/Immunology [facility]

## 2023-09-03 ENCOUNTER — Encounter: Payer: Self-pay | Admitting: Physician Assistant

## 2023-09-03 ENCOUNTER — Other Ambulatory Visit: Payer: Self-pay

## 2023-09-03 ENCOUNTER — Ambulatory Visit (INDEPENDENT_AMBULATORY_CARE_PROVIDER_SITE_OTHER): Payer: Medicare Other | Admitting: Physician Assistant

## 2023-09-03 DIAGNOSIS — Z96641 Presence of right artificial hip joint: Secondary | ICD-10-CM | POA: Diagnosis not present

## 2023-09-03 MED ORDER — DICLOFENAC SODIUM 75 MG PO TBEC: 75.0000 mg | DELAYED_RELEASE_TABLET | Freq: Two times a day (BID) | ORAL | 2 refills | Status: DC | PRN

## 2023-09-03 NOTE — Progress Notes (Signed)
 Post-Op Visit Note   Patient: Amanda Mccullough           Date of Birth: Dec 04, 1955           MRN: [id number] Visit Date: 09/03/2023 PCP: Collene Mares, PA   Assessment & Plan:  Chief Complaint:  Chief Complaint  Patient presents with   Right Hip - Follow-up    Right total hip arthroplasty 03/18/2023   Visit Diagnoses:  1. Status post total replacement of right hip     Plan: The patient comes in today approximately 6 months status post right total hip replacement.  She has had some discomfort over the incision that keloid otherwise no complaints.  Examination of the right hip reveals no pain with hip flexion or logroll.  She is neurovascularly intact distally.  At this point, she will continue to advance with activity as tolerated.  Dental prophylaxis reinforced for another year and a half.  Follow-up in 6 months for repeat evaluation and AP pelvis x-rays.  Call with concerns or questions.  Follow-Up Instructions: Return in about 6 months (around 03/05/2024).   Orders:  Orders Placed This Encounter  Procedures   XR Pelvis 1-2 Views   No orders of the defined types were placed in this encounter.   Imaging: No results found.  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   Osteoarthritis    right hip and left shoulder/upper left arm   Pre-diabetes    SOB (shortness of breath)    history - stress related per patient - saw cardio but all was normal    Family  History  Problem Relation Age of Onset   Hypertension Mother    Thyroid disease Mother    Glaucoma Mother    Hypertension Father    Cancer - Prostate Father    Hypertension Sister    Heart disease Sister    Thyroid disease Sister    Glaucoma Sister    Hypertension Sister    Hypertension Sister     Past Surgical History:  Procedure Laterality Date   ADENOIDECTOMY     BIOPSY BREAST Right 2011   benign   COLONOSCOPY     DIAGNOSTIC LAPAROSCOPY  1994   endometriosis and scar tissue removal   MYOMECTOMY ABDOMINAL APPROACH     x 1 -  1991, 2001   TONSILLECTOMY     As a teenager   TOTAL HIP ARTHROPLASTY Right 03/18/2023   Procedure: RIGHT TOTAL HIP ARTHROPLASTY ANTERIOR APPROACH;  Surgeon: Tarry Kos, MD;  Location: [facility];  Service: Orthopedics;  Laterality: Right;  3-C   WISDOM TOOTH EXTRACTION     Social History   Occupational History   Not on file  Tobacco Use   Smoking status: Never   Smokeless tobacco: Never  Vaping Use   Vaping status: Never Used  Substance and Sexual Activity   Alcohol use: Yes    Comment: occasional wine  Drug use: Not Currently   Sexual activity: Not Currently    Birth control/protection: None

## 2023-09-06 ENCOUNTER — Encounter: Payer: Self-pay | Admitting: Allergy

## 2023-09-06 LAB — ALLERGENS W/TOTAL IGE AREA 2

## 2023-10-01 ENCOUNTER — Other Ambulatory Visit: Payer: Self-pay | Admitting: Internal Medicine

## 2023-10-09 ENCOUNTER — Encounter: Admitting: Allergy

## 2023-10-10 NOTE — Progress Notes (Signed)
 Visit cancelled as patient was not aware to stop antihistamines for skin testing.  She would be rescheduled for skin testing and has been advised to stop antihistamines for 3 days prior.This encounter was created in error - please disregard.

## 2023-10-14 ENCOUNTER — Encounter: Payer: Self-pay | Admitting: Allergy

## 2023-10-17 ENCOUNTER — Encounter: Payer: Self-pay | Admitting: Orthopaedic Surgery

## 2023-10-17 NOTE — Telephone Encounter (Signed)
What do you think, xu?

## 2023-10-18 ENCOUNTER — Ambulatory Visit: Admitting: Allergy

## 2023-11-06 ENCOUNTER — Encounter: Payer: Self-pay | Admitting: Allergy

## 2023-11-08 ENCOUNTER — Ambulatory Visit: Admitting: Family Medicine

## 2023-11-21 ENCOUNTER — Ambulatory Visit (INDEPENDENT_AMBULATORY_CARE_PROVIDER_SITE_OTHER): Admitting: Allergy

## 2023-11-21 ENCOUNTER — Encounter: Payer: Self-pay | Admitting: Allergy

## 2023-11-21 DIAGNOSIS — J329 Chronic sinusitis, unspecified: Secondary | ICD-10-CM | POA: Diagnosis not present

## 2023-11-21 MED ORDER — XHANCE 93 MCG/ACT NA EXHU: 2.0000 | INHALANT_SUSPENSION | Freq: Every day | NASAL | 2 refills | Status: DC

## 2023-11-21 NOTE — Progress Notes (Signed)
 Follow-up Note  RE: Amanda Mccullough MRN: [id number] DOB: June 24, 1955 Date of Office Visit: 11/21/2023   History of present illness: Amanda Mccullough is a 68 y.o. female presenting today for skin testing visit.  She was last seen in the office on 08/30/23 for CRS.  She is in her usual state of health today without recent illness.  She has held antihistamines for at least 3 days for testing today.   Medication List: Current Outpatient Medications  Medication Sig Dispense Refill   amoxicillin  (AMOXIL ) 500 MG tablet Take 4 tablets by mouth 1 hour prior to dental procedure. 4 tablet 2   DENTA 5000 PLUS 1.1 % CREA dental cream at bedtime.     diclofenac  (VOLTAREN ) 75 MG EC tablet daily at 6 (six) AM.     diclofenac  (VOLTAREN ) 75 MG EC tablet Take 1 tablet (75 mg total) by mouth 2 (two) times daily as needed. 60 tablet 2   Ferrous Sulfate  Dried (FERROUS SULFATE  CR PO) Take 65 mg by mouth in the morning.     fluticasone (FLONASE) 50 MCG/ACT nasal spray Place 1-2 sprays into both nostrils daily as needed for allergies.     Fluticasone Propionate  (XHANCE ) 93 MCG/ACT EXHU 1-2x a day depending on symptom severity.     hydrochlorothiazide  (MICROZIDE ) 12.5 MG capsule Take 1 capsule (12.5 mg total) by mouth daily. 90 capsule 3   Multiple Vitamins-Minerals (CENTRUM SILVER 50+WOMEN PO) Take 1 tablet by mouth in the morning.     naproxen sodium (ALEVE) 220 MG tablet as needed (pain).     OVER THE COUNTER MEDICATION Apply 1 application  topically 2 (two) times daily as needed (joint pain). Hempvana Pain Relief Cream     propranolol (INDERAL) 20 MG tablet Take 20 mg by mouth in the morning.     rosuvastatin  (CRESTOR ) 5 MG tablet TAKE 1 TABLET (5 MG TOTAL) BY MOUTH DAILY. 90 tablet 3   simethicone (MYLICON) 80 MG chewable tablet Chew 80-160 mg by mouth every 6 (six) hours as needed for flatulence.     No current facility-administered medications for this visit.     Known medication allergies: Allergies   Allergen Reactions   Codeine Anaphylaxis and Hives   Diagnostics/Labs: Labs:  Component     Latest Ref Rng 08/30/2023  IgE (Immunoglobulin E), Serum     6 - 495 IU/mL 10   D Pteronyssinus IgE     Class 0 kU/L <0.10   D Farinae IgE     Class 0 kU/L <0.10   Cat Dander IgE     Class 0 kU/L <0.10   Dog Dander IgE     Class 0 kU/L <0.10   Mouse Urine IgE     Class 0 kU/L <0.10   [place] Grass IgE     Class 0 kU/L <0.10   Timothy Grass IgE     Class 0 kU/L <0.10   Johnson Grass IgE     Class 0 kU/L <0.10   Cockroach, German IgE     Class 0 kU/L <0.10   Penicillium Chrysogen IgE     Class 0 kU/L <0.10   Cladosporium Herbarum IgE     Class 0 kU/L <0.10   Aspergillus Fumigatus IgE     Class 0 kU/L <0.10   Alternaria Alternata IgE     Class 0 kU/L <0.10   Maple/Box Elder IgE     Class 0 kU/L <0.10   Common Silver Amelia Jurist IgE     Class  0 kU/L <0.10   [place], [place] IgE     Class 0 kU/L <0.10   Oak, White IgE     Class 0 kU/L <0.10   Elm, American IgE     Class 0 kU/L <0.10   Cottonwood IgE     Class 0 kU/L <0.10   Pecan, Hickory IgE     Class 0 kU/L <0.10   White Mulberry IgE     Class 0 kU/L <0.10   Ragweed, Short IgE     Class 0 kU/L <0.10   Pigweed, Rough IgE     Class 0 kU/L <0.10   Sheep Sorrel IgE Qn     Class 0 kU/L <0.10     Allergy testing:   Airborne Adult Perc - 11/21/23 0941     Time Antigen Placed 1610    Allergen Manufacturer Floyd Hutchinson    Location Back    Number of Test 55    1. Control-Buffer 50% Glycerol Negative    2. Control-Histamine 2+    3. Bahia Negative    4. [place] Negative    5. Johnson 2+    6. [place]  Blue Negative    7. Meadow Fescue Negative    8. Perennial Rye Negative    9. Timothy 2+    10. Ragweed Mix Negative    11. Cocklebur Negative    12. Plantain,  English Negative    13. Baccharis Negative    14. Dog Fennel Negative    15. Russian Thistle Negative    16. Lamb's Quarters Negative    17. Sheep Sorrell Negative     18. Rough Pigweed Negative    19. Marsh Elder, Rough 2+    20. Mugwort, Common Negative    21. Box, Elder Negative    22. Cedar, red Negative    23. Sweet Gum 2+    24. Pecan Pollen Negative    25. Pine Mix 2+    26. Walnut, Black Pollen 2+    27. Red Mulberry Negative    28. Ash Mix Negative    29. Birch Mix Negative    30. Beech American Negative    31. Cottonwood, [place] Negative    32. Hickory, White Negative    33. Maple Mix Negative    34. Oak, [place] Mix 2+    35. Sycamore Eastern Negative    36. Alternaria Alternata Negative    37. Cladosporium Herbarum Negative    38. Aspergillus Mix Negative    39. Penicillium Mix 2+    40. Bipolaris Sorokiniana (Helminthosporium) Negative    41. Drechslera Spicifera (Curvularia) Negative    42. Mucor Plumbeus Negative    43. Fusarium Moniliforme Negative    44. Aureobasidium Pullulans (pullulara) Negative    45. Rhizopus Oryzae 2+    46. Botrytis Cinera Negative    47. Epicoccum Nigrum Negative    48. Phoma Betae Negative    49. Dust Mite Mix 2+    50. Cat Hair 10,000 BAU/ml Negative    51.  Dog Epithelia Negative    52. Mixed Feathers Negative    53. Horse Epithelia Negative    54. Cockroach, German Negative    55. Tobacco Leaf Negative             Allergy testing results were read and interpreted by provider, documented by clinical staff.   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 Nonallergic rhinitis history with negative allergy testing in  the past. Symptoms worsened after relocation back to [place] 5 years ago. Current management with Flonase and Advil Cold and Sinus is suboptimal.  - blood test for environmental allergens were negative - skin testing for environmental allergens today showed: grasses, weeds, trees, indoor molds, outdoor molds, and dust mites - Copy of test results provided.  - Avoidance measures provided. - Can consider  allergy shots as a means of long-term control. - Allergy shots "re-train" and "reset" the immune system to ignore environmental allergens and decrease the resulting immune response to those allergens (sneezing, itchy watery eyes, runny nose, nasal congestion, etc).    - Allergy shots improve symptoms in 75-85% of patients.  - We can discuss more at a future appointment if the medications are not working for you. - Would try antihistamine like Xyzal 5mg  daily or Allegra 180mg  daily as needed for symptoms - Use Xhance  samples for trial to assess effectiveness in managing symptoms.   Xhance  is a special nasal device that allows for deeper deposition of the medication to your sinuses as well as concentrates it in your nasal cavity.   Use 2 sprays each nostril 1-2 times a day depending on symptom severity.  Use for 1-2 weeks at a time before stopping once symptoms improve.   - Recommend saline rinse (NAVAGE) during symptomatic periods, ensuring use of distilled or boiled water.  If can do more consistently several times a week is helpful to keep sinus clear/clean  Follow-up in 4-6 months or sooner if needed  I appreciate the opportunity to take part in Damyia's care. Please do not hesitate to contact me with questions.  Sincerely,   Catha Clink, MD Allergy/Immunology [facility]

## 2023-11-21 NOTE — Patient Instructions (Addendum)
 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 Nonallergic rhinitis history with negative allergy testing in the past. Symptoms worsened after relocation back to [place] 5 years ago. Current management with Flonase and Advil Cold and Sinus is suboptimal.  - blood test for environmental allergens were negative - skin testing for environmental allergens today showed: grasses, weeds, trees, indoor molds, outdoor molds, and dust mites - Copy of test results provided.  - Avoidance measures provided. - Can consider allergy shots as a means of long-term control. - Allergy shots "re-train" and "reset" the immune system to ignore environmental allergens and decrease the resulting immune response to those allergens (sneezing, itchy watery eyes, runny nose, nasal congestion, etc).    - Allergy shots improve symptoms in 75-85% of patients.  - We can discuss more at a future appointment if the medications are not working for you. - Would try antihistamine like Xyzal 5mg  daily or Allegra 180mg  daily as needed for symptoms - Use Xhance  samples for trial to assess effectiveness in managing symptoms.   Xhance  is a special nasal device that allows for deeper deposition of the medication to your sinuses as well as concentrates it in your nasal cavity.   Use 2 sprays each nostril 1-2 times a day depending on symptom severity.  Use for 1-2 weeks at a time before stopping once symptoms improve.   - Recommend saline rinse (NAVAGE) during symptomatic periods, ensuring use of distilled or boiled water.  If can do more consistently several times a week is helpful to keep sinus clear/clean  Follow-up in 4-6 months or sooner if needed

## 2023-11-25 ENCOUNTER — Telehealth: Payer: Self-pay

## 2023-11-25 NOTE — Telephone Encounter (Signed)
 Approved today by [facility] NCPDP 2017 PA Case: [id number], Status: Approved, Coverage Starts on: 06/19/2023 12:00:00 AM, Coverage Ends on: 06/17/2024 12:00:00 AM. Questions? Contact [phone]. Effective Date: 06/19/2023 Authorization Expiration Date: 06/17/2024

## 2023-11-25 NOTE — Telephone Encounter (Signed)
*  Asthma/Allergy   Pharmacy Patient Advocate Encounter   Received notification from CoverMyMeds that prior authorization for Xhance  93MCG/ACT exhaler suspension  is required/requested.   Insurance verification completed.   The patient is insured through [employer] .   Per test claim: PA required; PA submitted to above mentioned insurance via CoverMyMeds Key/confirmation #/EOC [id number] Status is pending

## 2023-12-06 ENCOUNTER — Ambulatory Visit: Admitting: Allergy

## 2024-01-30 ENCOUNTER — Telehealth: Payer: Self-pay | Admitting: Physician Assistant

## 2024-01-30 NOTE — Telephone Encounter (Signed)
 Called pt 1X and left vm for pt to call and reschedule appt. Provider will not be in office

## 2024-02-08 ENCOUNTER — Encounter (HOSPITAL_COMMUNITY): Payer: Self-pay | Admitting: *Deleted

## 2024-02-08 ENCOUNTER — Emergency Department (HOSPITAL_COMMUNITY)
Admission: EM | Admit: 2024-02-08 | Discharge: 2024-02-08 | Disposition: A | Discharge status: Home / Self Care | Attending: Emergency Medicine | Admitting: Emergency Medicine

## 2024-02-08 ENCOUNTER — Other Ambulatory Visit: Payer: Self-pay

## 2024-02-08 DIAGNOSIS — S6992XA Unspecified injury of left wrist, hand and finger(s), initial encounter: Secondary | ICD-10-CM | POA: Diagnosis present

## 2024-02-08 DIAGNOSIS — Z79899 Other long term (current) drug therapy: Secondary | ICD-10-CM | POA: Diagnosis not present

## 2024-02-08 DIAGNOSIS — W5503XA Scratched by cat, initial encounter: Secondary | ICD-10-CM | POA: Diagnosis not present

## 2024-02-08 DIAGNOSIS — S61432A Puncture wound without foreign body of left hand, initial encounter: Secondary | ICD-10-CM | POA: Diagnosis not present

## 2024-02-08 DIAGNOSIS — S6991XA Unspecified injury of right wrist, hand and finger(s), initial encounter: Secondary | ICD-10-CM | POA: Insufficient documentation

## 2024-02-08 MED ORDER — AMOXICILLIN-POT CLAVULANATE 875-125 MG PO TABS
1.0000 | ORAL_TABLET | Freq: Two times a day (BID) | ORAL | 0 refills | Status: AC
Start: 2024-02-08 — End: ?

## 2024-02-08 NOTE — ED Provider Notes (Signed)
 [place] EMERGENCY DEPARTMENT AT [facility] Provider Note   CSN: [id number] Arrival date & time: 02/08/24  1108     Patient presents with: Hand Injury   Amanda Mccullough is a 68 y.o. female presents with a cat scratch to the dorsum of the right hand.  She was concerned as a hematoma had formed, and was concerned for a vascular injury.  Otherwise does not complain of any pain and has no other further injuries at this time.    Hand Injury      Prior to Admission medications   Medication Sig Start Date End Date Taking? Authorizing Provider  amoxicillin -clavulanate (AUGMENTIN ) 875-125 MG tablet Take 1 tablet by mouth every 12 (twelve) hours. 02/08/24  Yes Myriam Dorn BROCKS, PA  amoxicillin  (AMOXIL ) 500 MG tablet Take 4 tablets by mouth 1 hour prior to dental procedure. 08/26/23   Jerri Kay HERO, MD  DENTA 5000 PLUS 1.1 % CREA dental cream at bedtime. 06/04/23   [provider]  diclofenac  (VOLTAREN ) 75 MG EC tablet daily at 6 (six) AM.    [provider]  diclofenac  (VOLTAREN ) 75 MG EC tablet Take 1 tablet (75 mg total) by mouth 2 (two) times daily as needed. 09/03/23   Jule Ronal CROME, PA-C  Ferrous Sulfate  Dried (FERROUS SULFATE  CR PO) Take 65 mg by mouth in the morning.    [provider]  Fluticasone Propionate  (XHANCE ) 93 MCG/ACT EXHU 1-2x a day depending on symptom severity. 08/30/23   Jeneal Danita Macintosh, MD  Fluticasone Propionate  (XHANCE ) 93 MCG/ACT EXHU Place 2 sprays into the nose daily. 11/21/23   Jeneal Danita Macintosh, MD  hydrochlorothiazide  (MICROZIDE ) 12.5 MG capsule Take 1 capsule (12.5 mg total) by mouth daily. 10/02/23   Santo Stanly LABOR, MD  Multiple Vitamins-Minerals (CENTRUM SILVER 50+WOMEN PO) Take 1 tablet by mouth in the morning.    [provider]  naproxen sodium (ALEVE) 220 MG tablet as needed (pain).    [provider]  OVER THE COUNTER MEDICATION Apply 1 application  topically 2 (two) times  daily as needed (joint pain). Hempvana Pain Relief Cream    [provider]  propranolol (INDERAL) 20 MG tablet Take 20 mg by mouth in the morning. 06/14/20   [provider]  rosuvastatin  (CRESTOR ) 5 MG tablet TAKE 1 TABLET (5 MG TOTAL) BY MOUTH DAILY. 10/02/23   Santo Stanly LABOR, MD  simethicone (MYLICON) 80 MG chewable tablet Chew 80-160 mg by mouth every 6 (six) hours as needed for flatulence.    [provider]    Allergies: Codeine    Review of Systems  Skin:  Positive for wound.  All other systems reviewed and are negative.   Updated Vital Signs BP (!) 166/94 (BP Location: Right Arm)   Pulse 100   Temp (!) 97.5 F (36.4 C)   Resp 16   Ht 5' 2 (1.575 m)   Wt 67.6 kg   LMP  (LMP Unknown)   SpO2 100%   BMI 27.25 kg/m   Physical Exam Vitals and nursing note reviewed.  Constitutional:      General: She is not in acute distress.    Appearance: She is well-developed.  HENT:     Head: Normocephalic and atraumatic.  Eyes:     Conjunctiva/sclera: Conjunctivae normal.  Cardiovascular:     Rate and Rhythm: Normal rate and regular rhythm.     Heart sounds: No murmur heard. Pulmonary:     Effort: Pulmonary effort is normal.  No respiratory distress.     Breath sounds: Normal breath sounds.  Abdominal:     Palpations: Abdomen is soft.     Tenderness: There is no abdominal tenderness.  Musculoskeletal:        General: No swelling.     Cervical back: Neck supple.  Skin:    General: Skin is warm and dry.     Capillary Refill: Capillary refill takes less than 2 seconds.     Comments: Small puncture wound noted to the dorsum of the left hand with noted ecchymosis and hematoma.  Neurovascular function is intact distal to the injury.  Neurological:     Mental Status: She is alert.  Psychiatric:        Mood and Affect: Mood normal.     (all labs ordered are listed, but only abnormal results are displayed) Labs Reviewed - No data to  display  EKG: None  Radiology: No results found.   Procedures   Medications Ordered in the ED - No data to display                                  Medical Decision Making  Medical Decision Making:   Amanda Mccullough is a 68 y.o. female who presented to the ED today with puncture wound to dorsum of left hand detailed above.     Complete initial physical exam performed, notably the patient  was alert and oriented no apparent distress.  Notable exam finding of a hematoma on the dorsum of the left hand..    Reviewed and confirmed nursing documentation for past medical history, family history, social history.    Initial Assessment:   With the patient's presentation of hematoma on the left hand, most likely diagnosis is cat scratch injury, puncture wound to the dorsum of the left hand.    Initial Plan:  Based on mechanism of the injury, imaging is deferred at this time. Based on her presentation, will opt to begin outpatient therapy with oral antibiotics. Defer further laboratory workup at this time. Objective evaluation as below reviewed  .   Reassessment and Plan:   Based on the mechanism, seems that the hematoma and ecchymosis is secondary to a minor puncture of a superficial vein of the dorsum of the left hand.  Also concerning for cat scratch of the dorsum of the left hand, will manage with outpatient prescription of Augmentin  and follow-up with your primary care in 2 weeks.  This is explained to the patient, she understands agrees has no further concerns at this time.       Final diagnoses:  Injury of right hand, initial encounter  Cat scratch    ED Discharge Orders          Ordered    amoxicillin -clavulanate (AUGMENTIN ) 875-125 MG tablet  Every 12 hours        02/08/24 1345               Myriam Dorn BROCKS, [place] 02/08/24 1345    Charlyn Sora, MD 02/09/24 1526

## 2024-02-08 NOTE — ED Notes (Signed)
 Ice pack applied to left hand.

## 2024-02-08 NOTE — Discharge Instructions (Addendum)
 As we discussed, if you begin to see new onset of redness or swelling despite antibiotics, please come back to the ED for reevaluation and likely IV antibiotics.  Otherwise follow-up with your primary care provider within the next 2 weeks.

## 2024-02-08 NOTE — ED Notes (Signed)
 Patient discharged by RN. Patient verbalizes understanding of instructions without additional questions. Ambulatory to lobby at time of discharge.

## 2024-02-08 NOTE — ED Triage Notes (Addendum)
 Pt c/o cat scratch to left hand today, swelling noted.

## 2024-02-08 NOTE — ED Triage Notes (Signed)
 States she was scratched by her cat, her and is swollen,

## 2024-03-10 ENCOUNTER — Ambulatory Visit: Admitting: Physician Assistant

## 2024-03-24 ENCOUNTER — Ambulatory Visit: Admitting: Orthopaedic Surgery

## 2024-03-25 ENCOUNTER — Other Ambulatory Visit: Payer: Self-pay

## 2024-03-25 ENCOUNTER — Encounter: Payer: Self-pay | Admitting: Allergy

## 2024-03-25 ENCOUNTER — Ambulatory Visit: Admitting: Allergy

## 2024-03-25 VITALS — BP 110/72 | HR 77 | Temp 98.1°F | Resp 17

## 2024-03-25 DIAGNOSIS — J329 Chronic sinusitis, unspecified: Secondary | ICD-10-CM

## 2024-03-25 MED ORDER — XHANCE 93 MCG/ACT NA EXHU: INHALANT_SUSPENSION | NASAL | Status: DC

## 2024-03-25 NOTE — Patient Instructions (Addendum)
 Chronic Rhinosinusitis - Continue avoidance measures for grasses, weeds, trees, indoor molds, outdoor molds, and dust mites - Can consider allergy  shots as a means of long-term control. - Allergy  shots re-train and reset the immune system to ignore environmental allergens and decrease the resulting immune response to those allergens (sneezing, itchy watery eyes, runny nose, nasal congestion, etc).    - Allergy  shots improve symptoms in 75-85% of patients.  - We can discuss more at a future appointment if the medications are not working for you. - Continue Xyzal 5mg  daily or Allegra 180mg  daily as needed.  If needed can rotate every 6 months or so if needed to maintain effectiveness.   - Continue Xhance  2 sprays each nostril 1-2 times a day depending on symptom severity.  - Recommend saline rinse (NAVAGE) during symptomatic periods, ensuring use of distilled or boiled water.  If can do more consistently several times a week is helpful to keep sinus clear/clean  Follow-up in 4-6 months or sooner if needed

## 2024-03-25 NOTE — Progress Notes (Signed)
 Follow-up Note  RE: Amanda Mccullough MRN: [id number] DOB: 09/15/1955 Date of Office Visit: 03/25/2024   History of present illness: Amanda Mccullough is a 68 y.o. female presenting today for follow-up of chronic rhinosinusitis.  She was last seen in the office on 11/21/2023 by myself. Discussed the use of AI scribe software for clinical note transcription with the patient, who gave verbal consent to proceed.  She has been experiencing persistent nasal congestion, particularly in one nostril, since June. The congestion is always present in one nostril while the other remains clear. She uses a nasal spray Xhance  once daily, which alleviates her symptoms when used consistently. If she forgets to use it, her symptoms return, but regular use keeps her in 'good shape'.  She takes levocetirizine (Xyzal) daily for her allergies, which include both seasonal and year-round allergens. Her allergies are influenced by environmental factors, with exposure to allergens such as pollen, molds, and dust mites.  No need for antibiotics for sinus infections since her last visit.      Review of systems: 10pt ROS negative unless noted above in HPI  Past medical/social/surgical/family history have been reviewed and are unchanged unless specifically indicated below.  No changes  Medication List: Current Outpatient Medications  Medication Sig Dispense Refill   amoxicillin  (AMOXIL ) 500 MG tablet Take 4 tablets by mouth 1 hour prior to dental procedure. 4 tablet 2   amoxicillin -clavulanate (AUGMENTIN ) 875-125 MG tablet Take 1 tablet by mouth every 12 (twelve) hours. 14 tablet 0   DENTA 5000 PLUS 1.1 % CREA dental cream at bedtime.     diclofenac  (VOLTAREN ) 75 MG EC tablet Take 1 tablet (75 mg total) by mouth 2 (two) times daily as needed. 60 tablet 2   Ferrous Sulfate  Dried (FERROUS SULFATE  CR PO) Take 65 mg by mouth in the morning.     Fluticasone Propionate  (XHANCE ) 93 MCG/ACT EXHU 1-2x a day depending on symptom  severity.     hydrochlorothiazide  (MICROZIDE ) 12.5 MG capsule Take 1 capsule (12.5 mg total) by mouth daily. 90 capsule 3   Multiple Vitamins-Minerals (CENTRUM SILVER 50+WOMEN PO) Take 1 tablet by mouth in the morning.     naproxen sodium (ALEVE) 220 MG tablet as needed (pain).     OVER THE COUNTER MEDICATION Apply 1 application  topically 2 (two) times daily as needed (joint pain). Hempvana Pain Relief Cream     propranolol (INDERAL) 20 MG tablet Take 20 mg by mouth in the morning.     rosuvastatin  (CRESTOR ) 5 MG tablet TAKE 1 TABLET (5 MG TOTAL) BY MOUTH DAILY. 90 tablet 3   simethicone (MYLICON) 80 MG chewable tablet Chew 80-160 mg by mouth every 6 (six) hours as needed for flatulence.     diclofenac  (VOLTAREN ) 75 MG EC tablet daily at 6 (six) AM.     No current facility-administered medications for this visit.     Known medication allergies: Allergies  Allergen Reactions   Codeine Anaphylaxis and Hives     Physical examination: Blood pressure 110/72, pulse 77, temperature 98.1 F (36.7 C), resp. rate 17, SpO2 98%.  General: Alert, interactive, in no acute distress. HEENT: PERRLA, TMs pearly gray, turbinates minimally edematous without discharge, post-pharynx non erythematous. Neck: Supple without lymphadenopathy. Lungs: Clear to auscultation without wheezing, rhonchi or rales. {no increased work of breathing. CV: Normal S1, S2 without murmurs. Abdomen: Nondistended, nontender. Skin: Warm and dry, without lesions or rashes. Extremities:  No clubbing, cyanosis or edema. Neuro:   Grossly intact.  Diagnostics/Labs:  None today  Assessment and plan:   Chronic Rhinosinusitis - Continue avoidance measures for grasses, weeds, trees, indoor molds, outdoor molds, and dust mites - Can consider allergy  shots as a means of long-term control. - Allergy  shots re-train and reset the immune system to ignore environmental allergens and decrease the resulting immune response to those  allergens (sneezing, itchy watery eyes, runny nose, nasal congestion, etc).    - Allergy  shots improve symptoms in 75-85% of patients.  - We can discuss more at a future appointment if the medications are not working for you. - Continue Xyzal 5mg  daily or Allegra 180mg  daily as needed.  If needed can rotate every 6 months or so if needed to maintain effectiveness.   - Continue Xhance  2 sprays each nostril 1-2 times a day depending on symptom severity.  - Recommend saline rinse (NAVAGE) during symptomatic periods, ensuring use of distilled or boiled water.  If can do more consistently several times a week is helpful to keep sinus clear/clean  Follow-up in 4-6 months or sooner if needed  I appreciate the opportunity to take part in [name]'s care. Please do not hesitate to contact me with questions.  Sincerely,   Danita Brain, MD Allergy /Immunology [facility]

## 2024-04-06 ENCOUNTER — Other Ambulatory Visit: Payer: Self-pay | Admitting: Physician Assistant

## 2024-04-14 ENCOUNTER — Ambulatory Visit (INDEPENDENT_AMBULATORY_CARE_PROVIDER_SITE_OTHER): Payer: Self-pay

## 2024-04-14 ENCOUNTER — Encounter: Payer: Self-pay | Admitting: Orthopaedic Surgery

## 2024-04-14 ENCOUNTER — Encounter: Payer: Self-pay | Admitting: Sports Medicine

## 2024-04-14 ENCOUNTER — Other Ambulatory Visit: Payer: Self-pay

## 2024-04-14 ENCOUNTER — Ambulatory Visit (INDEPENDENT_AMBULATORY_CARE_PROVIDER_SITE_OTHER): Admitting: Orthopaedic Surgery

## 2024-04-14 ENCOUNTER — Ambulatory Visit: Admitting: Sports Medicine

## 2024-04-14 DIAGNOSIS — Z96641 Presence of right artificial hip joint: Secondary | ICD-10-CM

## 2024-04-14 DIAGNOSIS — M1612 Unilateral primary osteoarthritis, left hip: Secondary | ICD-10-CM

## 2024-04-14 DIAGNOSIS — G8929 Other chronic pain: Secondary | ICD-10-CM | POA: Diagnosis not present

## 2024-04-14 DIAGNOSIS — M25552 Pain in left hip: Secondary | ICD-10-CM

## 2024-04-14 MED ORDER — METHYLPREDNISOLONE ACETATE 40 MG/ML IJ SUSP
80.0000 mg | INTRAMUSCULAR | Status: AC | PRN
Start: 2024-04-14 — End: 2024-04-14
  Administered 2024-04-14: 80 mg via INTRA_ARTICULAR

## 2024-04-14 MED ORDER — LIDOCAINE HCL 1 % IJ SOLN
4.0000 mL | INTRAMUSCULAR | Status: AC | PRN
Start: 2024-04-14 — End: 2024-04-14
  Administered 2024-04-14: 4 mL

## 2024-04-14 MED ORDER — AMOXICILLIN 500 MG PO TABS: ORAL_TABLET | ORAL | 2 refills | Status: AC

## 2024-04-14 NOTE — Progress Notes (Signed)
   Procedure Note  Patient: Amanda Mccullough             Date of Birth: 08-Nov-1955           MRN: [id number]             Visit Date: 04/14/2024  Procedures: Visit Diagnoses:  1. Primary osteoarthritis of left hip   2. Chronic left hip pain    Large Joint Inj: L hip joint on 04/14/2024 2:25 PM Indications: pain Details: 22 G 3.5 in needle, ultrasound-guided anterior approach Medications: 4 mL lidocaine  1 %; 80 mg methylPREDNISolone  acetate 40 MG/ML Outcome: tolerated well, no immediate complications  Procedure: US -guided intra-articular hip injection, Left After discussion on risks/benefits/indications and informed verbal consent was obtained, a timeout was performed. Patient was lying supine on exam table. The hip was cleaned with betadine  and alcohol swabs . Then utilizing ultrasound guidance, the patient's femoral head and neck junction was identified and subsequently injected with 4:2 lidocaine :depomedrol via an in-plane approach with ultrasound visualization of the injectate administered into the hip joint. Patient tolerated procedure well without immediate complications.  Procedure, treatment alternatives, risks and benefits explained, specific risks discussed. Consent was given by the patient. Immediately prior to procedure a time out was called to verify the correct patient, procedure, equipment, support staff and site/side marked as required. Patient was prepped and draped in the usual sterile fashion.     - patient tolerated procedure well, discussed post-injection protocol - follow-up with Dr. Jerri as indicated for hip; I am happy to see them as needed - she did want to readdress her shoulder --> can make an appt in next 10-14 days for this  Lonell Sprang, DO Primary Care Sports Medicine Physician  [facility] - Orthopedics  This note was dictated using Dragon naturally speaking software and may contain errors in syntax, spelling, or content which have not been identified  prior to signing this note.

## 2024-04-14 NOTE — Progress Notes (Signed)
 Office Visit Note   Patient: Amanda Mccullough           Date of Birth: 1956/06/03           MRN: [id number] Visit Date: 04/14/2024              Requested by: Cleotilde, Virginia  E, PA [address] [place],  [place] [address] PCP: Cleotilde, Virginia  E, PA   Assessment & Plan: Visit Diagnoses:  1. Status post total replacement of right hip   2. Primary osteoarthritis of left hip     Plan: History of Present Illness Amanda Mccullough is a 68 year old female with a history of right hip replacement who presents with left hip pain.  She experiences significant pain in her left hip, which has progressively worsened over the past few weeks. The pain is particularly bothersome during transitions, making daily activities increasingly difficult.  Previous clinicians have assessed her left hip as 'bone on bone' with a large subchondral cyst and bone spurs.  She manages her left hip pain with diclofenac , taking two tablets daily, and using Hempvana. The diclofenac  has been effective in controlling the pain, allowing her to remain mobile. She recently ran out of her medication and requested a refill, finding the prescription of sixty tablets with two refills insufficient given her current usage.  She has been very happy with her right hip replacement.    Assessment and Plan Left hip osteoarthritis with subchondral cyst and bone spurs Chronic left hip osteoarthritis with subchondral cysts and bone spurs causing increased pain. Considering surgical intervention next year due to insurance constraints. - Administer cortisone injection to left hip if available to reduce pain and inflammation. - Continue diclofenac  as needed, taper to once daily, then as-needed basis. - Discuss potential for left hip replacement surgery next year.  Status post right total hip arthroplasty Right total hip arthroplasty well-managed with no current issues. Satisfied with surgical outcome. - Schedule follow-up in one  year for two-year post-operative visit.  Dental prophylaxis Requires dental prophylaxis with antibiotics for upcoming dental procedures. - Prescribe antibiotics for dental prophylaxis for upcoming dental procedures.  Follow-Up Instructions: No follow-ups on file.   Orders:  Orders Placed This Encounter  Procedures  . XR Pelvis 1-2 Views   Meds ordered this encounter  Medications  . amoxicillin  (AMOXIL ) 500 MG tablet    Sig: Take 4 tablets by mouth 1 hour prior to dental procedure.    Dispense:  4 tablet    Refill:  2      Procedures: No procedures performed   Clinical Data: No additional findings.   Subjective: Chief Complaint  Patient presents with  . Right Hip - Follow-up    Right THA 03/18/2023    HPI  Review of Systems   Objective: Vital Signs: LMP  (LMP Unknown)   Physical Exam  Ortho Exam  Specialty Comments:  No specialty comments available.  Imaging: XR Pelvis 1-2 Views Result Date: 04/14/2024 Stable right total hip replacement without complications.  Left hip shows advanced degenerative joint disease with bone on bone joint space narrowing.    PMFS History: Patient Active Problem List   Diagnosis Date Noted  . Primary osteoarthritis of left hip 04/14/2024  . Status post total replacement of right hip 03/18/2023  . Primary osteoarthritis of right hip 12/16/2022  . History of concussion 06/08/2021  . Essential hypertension 10/12/2020  . Iron deficiency anemia 10/12/2020  . Mixed hyperlipidemia 10/12/2020  . Sciatica 10/12/2020  .  Insomnia 03/10/2013   Past Medical History:  Diagnosis Date  . Cataract    left eye - md just watching  . Hyperlipidemia   . Hypertension    Dr Stanly Leavens  . Iron deficiency anemia   . 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 Osteoarthritis    right hip and left shoulder/upper left arm  . Pre-diabetes   . SOB (shortness of breath)    history - stress related per patient - saw cardio but all was  normal    Family History  Problem Relation Age of Onset  . Hypertension Mother   . Thyroid disease Mother   . Glaucoma Mother   . Hypertension Father   . Cancer - Prostate Father   . Hypertension Sister   . Heart disease Sister   . Thyroid disease Sister   . Glaucoma Sister   . Hypertension Sister   . Hypertension Sister     Past Surgical History:  Procedure Laterality Date  . ADENOIDECTOMY    . BIOPSY BREAST Right 2011   benign  . COLONOSCOPY    . DIAGNOSTIC LAPAROSCOPY  1994   endometriosis and scar tissue removal  . MYOMECTOMY ABDOMINAL APPROACH     x 1 -  1991, 2001  . TONSILLECTOMY     As a teenager  . TOTAL HIP ARTHROPLASTY Right 03/18/2023   Procedure: RIGHT TOTAL HIP ARTHROPLASTY ANTERIOR APPROACH;  Surgeon: Jerri Kay HERO, MD;  Location: [facility] OR;  Service: Orthopedics;  Laterality: Right;  3-C  . WISDOM TOOTH EXTRACTION     Social History   Occupational History  . Not on file  Tobacco Use  . Smoking status: Never  . Smokeless tobacco: Never  Vaping Use  . Vaping status: Never Used  Substance and Sexual Activity  . Alcohol use: Yes    Comment: occasional wine  . Drug use: Not Currently  . Sexual activity: Not Currently    Birth control/protection: None

## 2024-04-20 ENCOUNTER — Encounter: Payer: Self-pay | Admitting: Radiology

## 2024-04-28 ENCOUNTER — Ambulatory Visit: Admitting: Sports Medicine

## 2024-04-28 ENCOUNTER — Encounter: Payer: Self-pay | Admitting: Sports Medicine

## 2024-04-28 ENCOUNTER — Other Ambulatory Visit: Payer: Self-pay

## 2024-04-28 ENCOUNTER — Other Ambulatory Visit (INDEPENDENT_AMBULATORY_CARE_PROVIDER_SITE_OTHER): Payer: Self-pay

## 2024-04-28 DIAGNOSIS — M25512 Pain in left shoulder: Secondary | ICD-10-CM | POA: Diagnosis not present

## 2024-04-28 DIAGNOSIS — M19012 Primary osteoarthritis, left shoulder: Secondary | ICD-10-CM

## 2024-04-28 DIAGNOSIS — G8929 Other chronic pain: Secondary | ICD-10-CM | POA: Diagnosis not present

## 2024-04-28 MED ORDER — METHYLPREDNISOLONE ACETATE 40 MG/ML IJ SUSP
80.0000 mg | INTRAMUSCULAR | Status: AC | PRN
Start: 2024-04-28 — End: 2024-04-28
  Administered 2024-04-28: 80 mg via INTRA_ARTICULAR

## 2024-04-28 MED ORDER — BUPIVACAINE HCL 0.25 % IJ SOLN
2.0000 mL | INTRAMUSCULAR | Status: AC | PRN
Start: 2024-04-28 — End: 2024-04-28
  Administered 2024-04-28: 2 mL via INTRA_ARTICULAR

## 2024-04-28 MED ORDER — LIDOCAINE HCL 1 % IJ SOLN
2.0000 mL | INTRAMUSCULAR | Status: AC | PRN
Start: 2024-04-28 — End: 2024-04-28
  Administered 2024-04-28: 2 mL

## 2024-04-28 NOTE — Progress Notes (Signed)
 Patient says that her shoulder feels stiff. She had an injection 07/2022 and says that she had a few months of relief at that time. She did her exercises, and has tried to do those again although she no longer has her sheet. She would like to continue with those exercises and has asked for a new sheet. She is here for re-evaluation and repeat injection today.

## 2024-04-28 NOTE — Progress Notes (Signed)
 Amanda Mccullough - 68 y.o. female MRN [id number]  Date of birth: [birth year]  Office Visit Note: Visit Date: 04/28/2024 PCP: Cleotilde, Virginia  E, PA Referred by: Cleotilde, [place]  E, PA  Subjective: Chief Complaint  Patient presents with   Left Shoulder - Pain   HPI: Amanda Mccullough is a pleasant 68 y.o. female who presents today for acute on chronic left shoulder pain with known advanced osteoarthritis.  She is having pain and stiffness in the left shoulder.  This does limit some of her function and she has diminished range of motion and some crepitus.  Back on 08/09/2022, needed to see with ultrasound-guided glenohumeral joint injection which gave her good relief of her pain until recently.  Last month we did perform ultrasound-guided intra-articular hip injection and the hip is feeling very well.  Lab Results  Component Value Date   HGBA1C 6.0 (H) 11/20/2022   Pertinent ROS were reviewed with the patient and found to be negative unless otherwise specified above in HPI.   Assessment & Plan: Visit Diagnoses:  1. Primary osteoarthritis of left shoulder   2. Chronic left shoulder pain    Plan: Impression is acute exacerbation of chronic left shoulder pain with severe glenohumeral joint osteoarthritic change.  Her arthritis does limit her motion and her function as well as produces pain within the shoulder joint.  She received excellent relief of her pain from prior injection back in February 2024.  Through shared decision making, we did repeat ultrasound-guided intra-articular injection, patient tolerated well.  Advised on postinjection protocol.  In the past we did give her some range of motion exercises for the shoulder to help try to preserve as much range of motion and function as possible, I did provide her with a new sheet today, she will begin these once daily after 48 to 72 hours of modified rest/activity from the injection.  For both the shoulder and her hip osteoarthritis, she may use  oral diclofenac  75 mg once to twice daily only as needed for her pain.  Did discuss the role for infrequent injections needed.  She is not interested in surgical intervention, although TSA versus reverse TSA could be a consideration given her degree of OA. She will f/u with me as needed.  Follow-up: Return if symptoms worsen or fail to improve.   Meds & Orders: No orders of the defined types were placed in this encounter.   Orders Placed This Encounter  Procedures   Large Joint Inj   XR Shoulder Left   US  Guided Needle Placement - No Linked Charges     Procedures: Large Joint Inj: L glenohumeral on 04/28/2024 1:54 PM Indications: pain Details: 22 G 3.5 in needle, ultrasound-guided posterior approach Medications: 2 mL lidocaine  1 %; 2 mL bupivacaine  0.25 %; 80 mg methylPREDNISolone  acetate 40 MG/ML Outcome: tolerated well, no immediate complications  US -guided glenohumeral joint injection, left shoulder After discussion on risks/benefits/indications, informed verbal consent was obtained. A timeout was then performed. The patient was positioned lying lateral recumbent on examination table. The patient's shoulder was prepped with betadine  and multiple alcohol swabs  and utilizing ultrasound guidance, the patient's glenohumeral joint was identified on ultrasound. Using ultrasound guidance a 22-gauge, 3.5 inch needle with a mixture of 2:2:2 cc's lidocaine :bupivicaine:depomedrol was directed from a lateral to medial direction via in-plane technique into the glenohumeral joint with visualization of appropriate spread of injectate into the joint. Patient tolerated the procedure well without immediate complications.      Procedure, treatment alternatives, risks and  benefits explained, specific risks discussed. Consent was given by the patient. Immediately prior to procedure a time out was called to verify the correct patient, procedure, equipment, support staff and site/side marked as required.  Patient was prepped and draped in the usual sterile fashion.          Clinical History: No specialty comments available.  She reports that she has never smoked. She has never used smokeless tobacco. No results for input(s): HGBA1C, LABURIC in the last 8760 hours.  Objective:   Vital Signs: LMP  (LMP Unknown)   Physical Exam  Gen: Well-appearing, in no acute distress; non-toxic CV: Well-perfused. Warm.  Resp: Breathing unlabored on room air; no wheezing. Psych: Fluid speech in conversation; appropriate affect; normal thought process  Ortho Exam - Left shoulder: No significant redness swelling or effusion.  There is limited active and passive range of motion with forward flexion about 90 degrees, abduction 100 degrees.  I am not able to take her much further passively and there is some crepitus through range of motion.  There is preserved internal and external strength and activation of the rotator cuff.  Imaging: No results found.  08/07/22: Advanced degenerative changes to the glenohumeral and AC joints with  multiple peri-articular osteophytes   Past Medical/Family/Surgical/Social History: Medications & Allergies reviewed per EMR, new medications updated. 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   Osteoarthritis    right hip and left shoulder/upper left arm   Pre-diabetes    SOB (shortness of breath)    history - stress  related per patient - saw cardio but all was normal   Family History  Problem Relation Age of Onset   Hypertension Mother    Thyroid disease Mother    Glaucoma Mother    Hypertension Father    Cancer - Prostate Father    Hypertension Sister    Heart disease Sister    Thyroid disease Sister    Glaucoma Sister    Hypertension Sister    Hypertension Sister    Past Surgical History:  Procedure Laterality Date   ADENOIDECTOMY     BIOPSY BREAST Right 2011   benign   COLONOSCOPY     DIAGNOSTIC LAPAROSCOPY  1994   endometriosis and scar tissue removal   MYOMECTOMY ABDOMINAL APPROACH     x 1 -  1991, 2001   TONSILLECTOMY     As a teenager   TOTAL HIP ARTHROPLASTY Right 03/18/2023   Procedure: RIGHT TOTAL HIP ARTHROPLASTY ANTERIOR APPROACH;  Surgeon: Jerri Kay HERO, MD;  Location: [facility] OR;  Service: Orthopedics;  Laterality: Right;  3-C   WISDOM TOOTH EXTRACTION     Social History   Occupational History   Not on file  Tobacco Use   Smoking status: Never   Smokeless tobacco: Never  Vaping Use   Vaping status: Never Used  Substance and Sexual Activity   Alcohol use: Yes  Comment: occasional wine   Drug use: Not Currently   Sexual activity: Not Currently    Birth control/protection: None

## 2024-05-28 ENCOUNTER — Encounter (INDEPENDENT_AMBULATORY_CARE_PROVIDER_SITE_OTHER): Payer: Self-pay | Admitting: Sports Medicine

## 2024-05-28 DIAGNOSIS — M19012 Primary osteoarthritis, left shoulder: Secondary | ICD-10-CM | POA: Diagnosis not present

## 2024-05-28 DIAGNOSIS — M1612 Unilateral primary osteoarthritis, left hip: Secondary | ICD-10-CM

## 2024-05-28 DIAGNOSIS — Z96641 Presence of right artificial hip joint: Secondary | ICD-10-CM

## 2024-06-03 ENCOUNTER — Other Ambulatory Visit: Payer: Self-pay | Admitting: Allergy

## 2024-06-09 NOTE — Telephone Encounter (Signed)
" °  °  Amanda Mccullough - 68 y.o. female MRN [id number]  Date of birth: 23-Dec-1955  *[name] has advanced left shoulder osteoarthritis which does limit her with many activities.  She has trialed medication as well as most recent injection, see note and ultrasound GHJ injection on 04/28/24.  She also has chronic hip pain, is status post right hip total arthroplasty (Dr. Jerri) as well as left hip advanced degenerative joint disease with near bone-on-bone osteoarthritic change.  This does prevent her from doing daily activities despite conservative treatment.  She is having limitation in flares of her pain with physical activity which includes moving her trash cans up and down the driveway given the incline.  -I do feel that based on her advanced osteoarthritic change for both the left shoulder, left hip and chronic bilateral hip pain that it would be best for her to avoid activities such as this as this has higher likelihood of exacerbating her pain and causing her discomfort.  I did clarify this and provide a note for [name] for her to present to the town and city.   *Imaging: 04/14/24: Stable right total hip replacement without complications.   Left hip shows advanced degenerative joint disease with bone on bone joint  space narrowing.   04/28/24: 4 views of the left shoulder including AP, Grashey, scapular Y and axial  views were ordered and reviewed by myself.  X-rays demonstrate severe,  bone-on-bone arthritic change of the glenohumeral joint.  There is notable  subchondral sclerosis as well as significant osteophytosis both inferiorly  and superiorly at the humeral head and the superior acetabulum.  There is  cortical cystic change within the humeral head given her degenerative  disease.  No acute fracture noted.   *Approximately 14 minutes was spent in discussion, documentation, review of x-ray imaging as well as time spent with note and activity restriction for the patient.  Lonell Sprang,  DO Primary Care Sports Medicine Physician  [facility] - Orthopedics  This note was dictated using Dragon naturally speaking software and may contain errors in syntax, spelling, or content which have not been identified prior to signing this note.     "

## 2024-06-17 NOTE — Telephone Encounter (Signed)
 This is an addendum for documentation purposes.  Method of note from 05/28/2024 was: Agricultural Engineer. Note written for patient to provide to responsible party.  Lonell Sprang, DO Primary Care Sports Medicine Physician  [facility] - Orthopedics

## 2024-08-17 ENCOUNTER — Ambulatory Visit: Admitting: Internal Medicine

## 2024-09-09 ENCOUNTER — Ambulatory Visit: Admitting: Allergy
